# Patient Record
Sex: Female | Born: 1985 | Race: Black or African American | Hispanic: No | Marital: Married | Smoking: Former smoker
Health system: Southern US, Community
[De-identification: ages and names within clinical notes are randomized; demographics above are authoritative.]

## PROBLEM LIST (undated history)

## (undated) DIAGNOSIS — E739 Lactose intolerance, unspecified: Secondary | ICD-10-CM

## (undated) DIAGNOSIS — I10 Essential (primary) hypertension: Secondary | ICD-10-CM

## (undated) DIAGNOSIS — O009 Unspecified ectopic pregnancy without intrauterine pregnancy: Secondary | ICD-10-CM

## (undated) DIAGNOSIS — K5900 Constipation, unspecified: Secondary | ICD-10-CM

## (undated) DIAGNOSIS — Z87442 Personal history of urinary calculi: Secondary | ICD-10-CM

## (undated) DIAGNOSIS — F329 Major depressive disorder, single episode, unspecified: Secondary | ICD-10-CM

## (undated) DIAGNOSIS — R600 Localized edema: Secondary | ICD-10-CM

## (undated) DIAGNOSIS — E559 Vitamin D deficiency, unspecified: Secondary | ICD-10-CM

## (undated) DIAGNOSIS — Z8489 Family history of other specified conditions: Secondary | ICD-10-CM

## (undated) DIAGNOSIS — N200 Calculus of kidney: Secondary | ICD-10-CM

## (undated) DIAGNOSIS — M549 Dorsalgia, unspecified: Secondary | ICD-10-CM

## (undated) DIAGNOSIS — M2550 Pain in unspecified joint: Secondary | ICD-10-CM

## (undated) DIAGNOSIS — K219 Gastro-esophageal reflux disease without esophagitis: Secondary | ICD-10-CM

## (undated) DIAGNOSIS — R51 Headache: Secondary | ICD-10-CM

## (undated) DIAGNOSIS — F32A Depression, unspecified: Secondary | ICD-10-CM

## (undated) DIAGNOSIS — T7840XA Allergy, unspecified, initial encounter: Secondary | ICD-10-CM

## (undated) DIAGNOSIS — F419 Anxiety disorder, unspecified: Secondary | ICD-10-CM

## (undated) DIAGNOSIS — M1990 Unspecified osteoarthritis, unspecified site: Secondary | ICD-10-CM

## (undated) DIAGNOSIS — M069 Rheumatoid arthritis, unspecified: Secondary | ICD-10-CM

## (undated) DIAGNOSIS — J45909 Unspecified asthma, uncomplicated: Secondary | ICD-10-CM

## (undated) HISTORY — DX: Major depressive disorder, single episode, unspecified: F32.9

## (undated) HISTORY — DX: Dorsalgia, unspecified: M54.9

## (undated) HISTORY — DX: Rheumatoid arthritis, unspecified: M06.9

## (undated) HISTORY — DX: Vitamin D deficiency, unspecified: E55.9

## (undated) HISTORY — DX: Depression, unspecified: F32.A

## (undated) HISTORY — DX: Unspecified asthma, uncomplicated: J45.909

## (undated) HISTORY — DX: Unspecified osteoarthritis, unspecified site: M19.90

## (undated) HISTORY — DX: Gastro-esophageal reflux disease without esophagitis: K21.9

## (undated) HISTORY — DX: Allergy, unspecified, initial encounter: T78.40XA

## (undated) HISTORY — DX: Lactose intolerance, unspecified: E73.9

## (undated) HISTORY — DX: Localized edema: R60.0

## (undated) HISTORY — DX: Constipation, unspecified: K59.00

## (undated) HISTORY — DX: Anxiety disorder, unspecified: F41.9

## (undated) HISTORY — DX: Pain in unspecified joint: M25.50

---

## 2008-09-18 ENCOUNTER — Emergency Department (HOSPITAL_COMMUNITY): Admission: EM | Admit: 2008-09-18 | Discharge: 2008-09-18 | Payer: Self-pay | Admitting: Emergency Medicine

## 2009-10-01 ENCOUNTER — Encounter: Admission: RE | Admit: 2009-10-01 | Discharge: 2009-10-01 | Payer: Self-pay | Admitting: General Surgery

## 2010-07-25 ENCOUNTER — Inpatient Hospital Stay (HOSPITAL_COMMUNITY): Admission: AD | Admit: 2010-07-25 | Discharge: 2010-07-28 | Payer: Self-pay | Admitting: Obstetrics and Gynecology

## 2011-02-25 LAB — CBC
HCT: 32.2 % — ABNORMAL LOW (ref 36.0–46.0)
HCT: 38.3 % (ref 36.0–46.0)
MCH: 31.1 pg (ref 26.0–34.0)
MCH: 30.4 pg (ref 26.0–34.0)
MCHC: 34 g/dL (ref 30.0–36.0)
MCHC: 33.9 g/dL (ref 30.0–36.0)
MCV: 91.5 fL (ref 78.0–100.0)
Platelets: 145 10*3/uL — ABNORMAL LOW (ref 150–400)
RBC: 4.27 MIL/uL (ref 3.87–5.11)
WBC: 16.8 10*3/uL — ABNORMAL HIGH (ref 4.0–10.5)

## 2011-09-13 LAB — URINALYSIS, ROUTINE W REFLEX MICROSCOPIC
Bilirubin Urine: NEGATIVE
Protein, ur: NEGATIVE
Specific Gravity, Urine: 1.025
pH: 6

## 2011-09-13 LAB — URINE CULTURE

## 2011-09-13 LAB — URINE MICROSCOPIC-ADD ON

## 2011-12-13 DIAGNOSIS — O009 Unspecified ectopic pregnancy without intrauterine pregnancy: Secondary | ICD-10-CM

## 2011-12-13 HISTORY — DX: Unspecified ectopic pregnancy without intrauterine pregnancy: O00.90

## 2012-11-07 ENCOUNTER — Encounter (HOSPITAL_COMMUNITY): Payer: Self-pay

## 2012-11-07 ENCOUNTER — Inpatient Hospital Stay (HOSPITAL_COMMUNITY): Payer: BC Managed Care – PPO

## 2012-11-07 ENCOUNTER — Inpatient Hospital Stay (HOSPITAL_COMMUNITY)
Admission: AD | Admit: 2012-11-07 | Discharge: 2012-11-07 | Disposition: A | Discharge status: Home / Self Care | Payer: BC Managed Care – PPO | Source: Ambulatory Visit | Attending: Obstetrics and Gynecology | Admitting: Obstetrics and Gynecology

## 2012-11-07 DIAGNOSIS — O00109 Unspecified tubal pregnancy without intrauterine pregnancy: Secondary | ICD-10-CM | POA: Insufficient documentation

## 2012-11-07 LAB — TYPE AND SCREEN
ABO/RH(D): B POS
Antibody Screen: NEGATIVE

## 2012-11-07 LAB — CBC WITH DIFFERENTIAL/PLATELET
Basophils Relative: 0 % (ref 0–1)
Eosinophils Relative: 0 % (ref 0–5)
Hemoglobin: 12.2 g/dL (ref 12.0–15.0)
Lymphs Abs: 0.5 10*3/uL — ABNORMAL LOW (ref 0.7–4.0)
MCH: 29.3 pg (ref 26.0–34.0)
MCV: 84.4 fL (ref 78.0–100.0)
Neutro Abs: 18.2 10*3/uL — ABNORMAL HIGH (ref 1.7–7.7)

## 2012-11-07 LAB — CREATININE, SERUM
Creatinine, Ser: 0.72 mg/dL (ref 0.50–1.10)
GFR calc non Af Amer: >90 mL/min (ref >90)

## 2012-11-07 LAB — HCG, QUANTITATIVE, PREGNANCY: hCG, Beta Chain, Quant, S: 5171 m[IU]/mL — ABNORMAL HIGH (ref <5)

## 2012-11-07 LAB — AST: AST: 12 U/L (ref 0–37)

## 2012-11-07 MED ORDER — METHOTREXATE INJECTION FOR WOMEN'S HOSPITAL
50.0000 mg/m2 | Freq: Once | INTRAMUSCULAR | Status: AC
  Administered 2012-11-07: 85 mg via INTRAMUSCULAR
  Filled 2012-11-07: qty 1.7

## 2012-11-07 NOTE — MAU Note (Signed)
Pt reports left side and left lower abd pain x 3 hours, positive preg test 2 weeks ago, LMP 09/28/2012

## 2012-11-07 NOTE — ED Provider Notes (Signed)
History  Diane [name] 26 y.o. G2P1001      Chief Complaint  Patient presents with  . Vaginal Bleeding       Early pregnancy   Past Medical History  Diagnosis Date  . No pertinent past medical history     Past Surgical History  Procedure Date  . No past surgeries     History reviewed. No pertinent family history.  History  Substance Use Topics  . Smoking status: Former Games developer  . Smokeless tobacco: Not on file  . Alcohol Use: No    Allergies: No Known Allergies  Prescriptions prior to admission  Medication Sig Dispense Refill  . acetaminophen (TYLENOL) 500 MG tablet Take 1,000 mg by mouth daily as needed. For pain      . pantoprazole (PROTONIX) 40 MG tablet Take 40 mg by mouth daily as needed. For acid reflux      . Prenatal Vit-Fe Fumarate-FA (PRENATAL MULTIVITAMIN) TABS Take 1 tablet by mouth daily.        Physical Exam   Blood pressure 125/65, pulse 100, temperature 98.9 F (37.2 C), temperature source Oral, resp. rate 16, height 5\' 3"  (1.6 m), weight 65.953 kg (145 lb 6.4 oz), last menstrual period 09/28/2012, SpO2 100.00%.  Lungs clear bilat. RCR, no murmur Abdo soft, no rebound, mildly tender on left side. VE deferred  [place] No IUP.  Ovaries wnl with small CL cyst on Rt ovary.  Left adnexal mass separate from ovary, about 3+ cm in dameter, prob. GS 5+ mm, prob. YS, no EP.  Quant BHCG  5171 CBC wnl except WBC 19.7 AST/ALT wnl U/A wnl  ED Course  Hemodynamically stable  A/P  Left non ruptured ectopic pregnancy in a stable patient.  Quant BHCG 5171.  Good candidate for MTX.  Management of ectopic pregnancy explained.  Surgical approach discussed.  Patient opts for MTX.  Risks reviewed including rupture and hemorrhage ad risk of death.  Importance of presentation if increased pain/shoulder pain stressed.  Importance of f/u Quant BHCG explained.  Eventual need for 2nd MTX or surgery discussed.    Pt understands and agrees.  Genia Del MD   11/07/2012  At 10:52 am

## 2012-11-10 ENCOUNTER — Inpatient Hospital Stay (HOSPITAL_COMMUNITY)
Admission: AD | Admit: 2012-11-10 | Discharge: 2012-11-10 | Disposition: A | Discharge status: Home / Self Care | Payer: BC Managed Care – PPO | Source: Ambulatory Visit | Attending: Obstetrics & Gynecology | Admitting: Obstetrics & Gynecology

## 2012-11-10 DIAGNOSIS — O00109 Unspecified tubal pregnancy without intrauterine pregnancy: Secondary | ICD-10-CM | POA: Insufficient documentation

## 2012-11-10 LAB — HCG, QUANTITATIVE, PREGNANCY: hCG, Beta Chain, Quant, S: 8421 m[IU]/mL — ABNORMAL HIGH (ref <5)

## 2012-11-10 NOTE — Progress Notes (Signed)
Notified of BHCG level. Continue to monitor pt to come to office on Day 7 for repeat BHCG

## 2012-11-10 NOTE — MAU Note (Signed)
Pt reports having some bleeding in the morning and soreness in lower abd.

## 2012-11-11 HISTORY — PX: OTHER SURGICAL HISTORY: SHX169

## 2012-11-18 ENCOUNTER — Encounter (HOSPITAL_COMMUNITY): Payer: Self-pay | Admitting: Anesthesiology

## 2012-11-18 ENCOUNTER — Inpatient Hospital Stay (HOSPITAL_COMMUNITY): Payer: BC Managed Care – PPO

## 2012-11-18 ENCOUNTER — Encounter (HOSPITAL_COMMUNITY)
Admission: AD | Disposition: A | Discharge status: Home / Self Care | Payer: Self-pay | Source: Ambulatory Visit | Attending: Obstetrics and Gynecology

## 2012-11-18 ENCOUNTER — Ambulatory Visit (HOSPITAL_COMMUNITY)
Admission: AD | Admit: 2012-11-18 | Discharge: 2012-11-18 | Disposition: A | Discharge status: Home / Self Care | Payer: BC Managed Care – PPO | Source: Ambulatory Visit | Attending: Obstetrics and Gynecology | Admitting: Obstetrics and Gynecology

## 2012-11-18 ENCOUNTER — Inpatient Hospital Stay (HOSPITAL_COMMUNITY): Payer: BC Managed Care – PPO | Admitting: Anesthesiology

## 2012-11-18 ENCOUNTER — Encounter (HOSPITAL_COMMUNITY): Payer: Self-pay

## 2012-11-18 DIAGNOSIS — O00109 Unspecified tubal pregnancy without intrauterine pregnancy: Principal | ICD-10-CM | POA: Insufficient documentation

## 2012-11-18 DIAGNOSIS — Z9889 Other specified postprocedural states: Secondary | ICD-10-CM

## 2012-11-18 DIAGNOSIS — O009 Unspecified ectopic pregnancy without intrauterine pregnancy: Secondary | ICD-10-CM

## 2012-11-18 HISTORY — DX: Unspecified ectopic pregnancy without intrauterine pregnancy: O00.90

## 2012-11-18 HISTORY — PX: DILATION AND CURETTAGE OF UTERUS: SHX78

## 2012-11-18 HISTORY — PX: LAPAROSCOPY: SHX197

## 2012-11-18 LAB — CBC WITH DIFFERENTIAL/PLATELET
Basophils Absolute: 0 10*3/uL (ref 0.0–0.1)
HCT: 33.6 % — ABNORMAL LOW (ref 36.0–46.0)
Hemoglobin: 11.4 g/dL — ABNORMAL LOW (ref 12.0–15.0)
Lymphocytes Relative: 8 % — ABNORMAL LOW (ref 12–46)
Monocytes Absolute: 1.1 10*3/uL — ABNORMAL HIGH (ref 0.1–1.0)
Monocytes Relative: 8 % (ref 3–12)
Neutro Abs: 11.4 10*3/uL — ABNORMAL HIGH (ref 1.7–7.7)
RBC: 3.96 MIL/uL (ref 3.87–5.11)
RDW: 14.1 % (ref 11.5–15.5)
WBC: 13.7 10*3/uL — ABNORMAL HIGH (ref 4.0–10.5)

## 2012-11-18 LAB — TYPE AND SCREEN: Antibody Screen: NEGATIVE

## 2012-11-18 LAB — COMPREHENSIVE METABOLIC PANEL
AST: 13 U/L (ref 0–37)
CO2: 22 mEq/L (ref 19–32)
Chloride: 103 mEq/L (ref 96–112)
Creatinine, Ser: 0.74 mg/dL (ref 0.50–1.10)
GFR calc non Af Amer: >90 mL/min (ref >90)
Total Bilirubin: 0.4 mg/dL (ref 0.3–1.2)

## 2012-11-18 SURGERY — LAPAROSCOPY OPERATIVE
Anesthesia: General | Site: Vagina | Wound class: Clean Contaminated

## 2012-11-18 MED ORDER — MEPERIDINE HCL 25 MG/ML IJ SOLN: 6.2500 mg | INTRAMUSCULAR | Status: DC | PRN

## 2012-11-18 MED ORDER — PROPOFOL 10 MG/ML IV EMUL
INTRAVENOUS | Status: DC | PRN
  Administered 2012-11-18: 150 mg via INTRAVENOUS

## 2012-11-18 MED ORDER — KETOROLAC TROMETHAMINE 30 MG/ML IJ SOLN: 15.0000 mg | Freq: Once | INTRAMUSCULAR | Status: DC | PRN

## 2012-11-18 MED ORDER — PROMETHAZINE HCL 25 MG/ML IJ SOLN: 6.2500 mg | INTRAMUSCULAR | Status: DC | PRN

## 2012-11-18 MED ORDER — SUCCINYLCHOLINE CHLORIDE 20 MG/ML IJ SOLN
INTRAMUSCULAR | Status: DC | PRN
  Administered 2012-11-18: 100 mg via INTRAVENOUS

## 2012-11-18 MED ORDER — MIDAZOLAM HCL 2 MG/2ML IJ SOLN: 0.5000 mg | Freq: Once | INTRAMUSCULAR | Status: DC | PRN

## 2012-11-18 MED ORDER — MIDAZOLAM HCL 5 MG/5ML IJ SOLN
INTRAMUSCULAR | Status: DC | PRN
  Administered 2012-11-18: 2 mg via INTRAVENOUS

## 2012-11-18 MED ORDER — FENTANYL CITRATE 0.05 MG/ML IJ SOLN
INTRAMUSCULAR | Status: DC | PRN
  Administered 2012-11-18: 100 ug via INTRAVENOUS
  Administered 2012-11-18 (×3): 50 ug via INTRAVENOUS

## 2012-11-18 MED ORDER — GLYCOPYRROLATE 0.2 MG/ML IJ SOLN
INTRAMUSCULAR | Status: DC | PRN
  Administered 2012-11-18: 1 mg via INTRAVENOUS

## 2012-11-18 MED ORDER — DOXYCYCLINE HYCLATE 100 MG IV SOLR
100.0000 mg | Freq: Once | INTRAVENOUS | Status: AC
  Administered 2012-11-18: 100 mg via INTRAVENOUS
  Filled 2012-11-18: qty 100

## 2012-11-18 MED ORDER — LACTATED RINGERS IV SOLN
INTRAVENOUS | Status: DC | PRN
  Administered 2012-11-18 (×2): via INTRAVENOUS

## 2012-11-18 MED ORDER — HYDROCODONE-ACETAMINOPHEN 5-500 MG PO TABS: 1.0000 | ORAL_TABLET | Freq: Four times a day (QID) | ORAL | Status: DC | PRN

## 2012-11-18 MED ORDER — CITRIC ACID-SODIUM CITRATE 334-500 MG/5ML PO SOLN
30.0000 mL | Freq: Once | ORAL | Status: AC
  Administered 2012-11-18: 30 mL via ORAL
  Filled 2012-11-18: qty 15

## 2012-11-18 MED ORDER — ROCURONIUM BROMIDE 100 MG/10ML IV SOLN
INTRAVENOUS | Status: DC | PRN
  Administered 2012-11-18: 10 mg via INTRAVENOUS
  Administered 2012-11-18: 30 mg via INTRAVENOUS

## 2012-11-18 MED ORDER — NEOSTIGMINE METHYLSULFATE 1 MG/ML IJ SOLN
INTRAMUSCULAR | Status: DC | PRN
  Administered 2012-11-18: 5 mg via INTRAVENOUS

## 2012-11-18 MED ORDER — IBUPROFEN 800 MG PO TABS: 800.0000 mg | ORAL_TABLET | Freq: Three times a day (TID) | ORAL | Status: DC | PRN

## 2012-11-18 MED ORDER — KETOROLAC TROMETHAMINE 30 MG/ML IJ SOLN
INTRAMUSCULAR | Status: DC | PRN
  Administered 2012-11-18: 30 mg via INTRAMUSCULAR
  Administered 2012-11-18: 30 mg via INTRAVENOUS

## 2012-11-18 MED ORDER — DEXAMETHASONE SODIUM PHOSPHATE 4 MG/ML IJ SOLN
INTRAMUSCULAR | Status: DC | PRN
  Administered 2012-11-18: 10 mg via INTRAVENOUS

## 2012-11-18 MED ORDER — BUPIVACAINE HCL (PF) 0.25 % IJ SOLN
INTRAMUSCULAR | Status: DC | PRN
  Administered 2012-11-18: 10 mL

## 2012-11-18 MED ORDER — LACTATED RINGERS IR SOLN
Status: DC | PRN
  Administered 2012-11-18: 3000 mL

## 2012-11-18 MED ORDER — LIDOCAINE HCL (CARDIAC) 20 MG/ML IV SOLN
INTRAVENOUS | Status: DC | PRN
  Administered 2012-11-18: 20 mg via INTRAVENOUS

## 2012-11-18 MED ORDER — ONDANSETRON HCL 4 MG/2ML IJ SOLN
INTRAMUSCULAR | Status: DC | PRN
  Administered 2012-11-18: 4 mg via INTRAVENOUS

## 2012-11-18 MED ORDER — FENTANYL CITRATE 0.05 MG/ML IJ SOLN: 25.0000 ug | INTRAMUSCULAR | Status: DC | PRN

## 2012-11-18 MED ORDER — LACTATED RINGERS IV SOLN: INTRAVENOUS | Status: DC

## 2012-11-18 MED ORDER — SODIUM CHLORIDE 0.9 % IV SOLN: INTRAVENOUS | Status: DC

## 2012-11-18 SURGICAL SUPPLY — 44 items
BARRIER ADHS 3X4 INTERCEED (GAUZE/BANDAGES/DRESSINGS) IMPLANT
BLADE SURG 15 STRL LF C SS BP (BLADE) ×2 IMPLANT
BLADE SURG 15 STRL SS (BLADE) ×1
CABLE HIGH FREQUENCY MONO STRZ (ELECTRODE) ×3 IMPLANT
CHLORAPREP W/TINT 26ML (MISCELLANEOUS) IMPLANT
CLOTH BEACON ORANGE TIMEOUT ST (SAFETY) ×3 IMPLANT
COVER MAYO STAND STRL (DRAPES) IMPLANT
DERMABOND ADVANCED (GAUZE/BANDAGES/DRESSINGS) ×1
DERMABOND ADVANCED .7 DNX12 (GAUZE/BANDAGES/DRESSINGS) ×2 IMPLANT
DURAPREP 26ML APPLICATOR (WOUND CARE) ×3 IMPLANT
ELECT NEEDLE TIP 2.8 STRL (NEEDLE) ×3 IMPLANT
FORCEPS CUTTING 33CM 5MM (CUTTING FORCEPS) ×3 IMPLANT
GLOVE BIO SURGEON STRL SZ 6.5 (GLOVE) ×6 IMPLANT
GLOVE BIOGEL PI IND STRL 7.0 (GLOVE) ×4 IMPLANT
GLOVE BIOGEL PI INDICATOR 7.0 (GLOVE) ×2
GOWN PREVENTION PLUS LG XLONG (DISPOSABLE) ×9 IMPLANT
IV LACTATED RINGER IRRG 3000ML (IV SOLUTION) ×1
IV LR IRRIG 3000ML ARTHROMATIC (IV SOLUTION) ×2 IMPLANT
IV STOPCOCK 4 WAY 40  W/Y SET (IV SOLUTION)
IV STOPCOCK 4 WAY 40 W/Y SET (IV SOLUTION) IMPLANT
MANIPULATOR UTERINE 4.5 ZUMI (MISCELLANEOUS) IMPLANT
NEEDLE INSUFFLATION 14GA 120MM (NEEDLE) IMPLANT
PACK LAPAROSCOPY BASIN (CUSTOM PROCEDURE TRAY) ×3 IMPLANT
PENCIL BUTTON HOLSTER BLD 10FT (ELECTRODE) ×3 IMPLANT
POUCH SPECIMEN RETRIEVAL 10MM (ENDOMECHANICALS) ×3 IMPLANT
PROTECTOR NERVE ULNAR (MISCELLANEOUS) ×3 IMPLANT
SCISSORS LAP 5X35 DISP (ENDOMECHANICALS) IMPLANT
SEALER TISSUE G2 CVD JAW 35 (ENDOMECHANICALS) IMPLANT
SEALER TISSUE G2 CVD JAW 45CM (ENDOMECHANICALS)
SET IRRIG TUBING LAPAROSCOPIC (IRRIGATION / IRRIGATOR) ×3 IMPLANT
SOLUTION ELECTROLUBE (MISCELLANEOUS) IMPLANT
STENT BALLN UTERINE 3CM 6FR (Stent) IMPLANT
STENT BALLN UTERINE 4CM 6FR (STENTS) IMPLANT
SUT VICRYL 0 UR6 27IN ABS (SUTURE) IMPLANT
SUT VICRYL 4-0 PS2 18IN ABS (SUTURE) IMPLANT
SYR 50ML LL SCALE MARK (SYRINGE) IMPLANT
SYR 5ML LL (SYRINGE) ×3 IMPLANT
TOWEL OR 17X24 6PK STRL BLUE (TOWEL DISPOSABLE) ×6 IMPLANT
TRAY FOLEY CATH 14FR (SET/KITS/TRAYS/PACK) ×3 IMPLANT
TROCAR BALLN 12MMX100 BLUNT (TROCAR) IMPLANT
TROCAR XCEL NON-BLD 11X100MML (ENDOMECHANICALS) IMPLANT
TROCAR XCEL NON-BLD 5MMX100MML (ENDOMECHANICALS) ×6 IMPLANT
WARMER LAPAROSCOPE (MISCELLANEOUS) ×3 IMPLANT
WATER STERILE IRR 1000ML POUR (IV SOLUTION) ×3 IMPLANT

## 2012-11-18 NOTE — Anesthesia Preprocedure Evaluation (Signed)
Anesthesia Evaluation  Patient identified by MRN, date of birth, ID band Patient awake    Reviewed: Allergy & Precautions, H&P , Patient's Chart, lab work & pertinent test results, reviewed documented beta blocker date and time   History of Anesthesia Complications Negative for: history of anesthetic complications  Airway Mallampati: II TM Distance: >3 FB Neck ROM: full    Dental No notable dental hx.    Pulmonary neg pulmonary ROS,  breath sounds clear to auscultation  Pulmonary exam normal       Cardiovascular Exercise Tolerance: Good negative cardio ROS  Rhythm:regular Rate:Normal     Neuro/Psych negative neurological ROS  negative psych ROS   GI/Hepatic negative GI ROS, Neg liver ROS,   Endo/Other  negative endocrine ROS  Renal/GU negative Renal ROS     Musculoskeletal   Abdominal   Peds  Hematology negative hematology ROS (+)   Anesthesia Other Findings   Reproductive/Obstetrics negative OB ROS                           Anesthesia Physical Anesthesia Plan  ASA: II and emergent  Anesthesia Plan: General ETT   Post-op Pain Management:    Induction: Rapid sequence, Cricoid pressure planned and Intravenous  Airway Management Planned: Oral ETT  Additional Equipment:   Intra-op Plan:   Post-operative Plan:   Informed Consent: I have reviewed the patients History and Physical, chart, labs and discussed the procedure including the risks, benefits and alternatives for the proposed anesthesia with the patient or authorized representative who has indicated his/her understanding and acceptance.   Dental Advisory Given  Plan Discussed with: CRNA and Surgeon  Anesthesia Plan Comments:         Anesthesia Quick Evaluation

## 2012-11-18 NOTE — Brief Op Note (Signed)
11/18/2012  11:11 AM  PATIENT:  Diane [name]  26 y.o. female  PRE-OPERATIVE DIAGNOSIS:  Ruptured Ectopic( left)  POST-OPERATIVE DIAGNOSIS:  Ruptured Ectopic( left)  PROCEDURE:  Operative laparoscopy, Left salpingectomy, suction Dilation and currettage  SURGEON:  Surgeon(s) and Role:        * Samhita Kretsch Cathie Beams, MD - PRIMARY  PHYSICIAN ASSISTANT:   ASSISTANTS: Noland Fordyce, MD   ANESTHESIA:   general FINDINGS: HEMOPERITONEUM, NL APPENDIX, NL RIGHT TUBE AND OVARY, LEFT TUBE WITH DISTAL ECTOPIC ADHERENT TO LEFT SIDE WALL, NL LEFT OVARY  EBL:  Total I/O In: 1200 [I.V.:1200] Out: 275 [Urine:200; Blood:75]  BLOOD ADMINISTERED:none  DRAINS: none   LOCAL MEDICATIONS USED:  MARCAINE     SPECIMEN:  Source of Specimen:  left tube with ectopic  DISPOSITION OF SPECIMEN:  PATHOLOGY  COUNTS:  YES  TOURNIQUET:  * No tourniquets in log *  DICTATION: .Other Dictation: Dictation Number   PLAN OF CARE: Discharge to home after PACU  PATIENT DISPOSITION:  PACU - hemodynamically stable.   Delay start of Pharmacological VTE agent (>24hrs) due to surgical blood loss or risk of bleeding: no

## 2012-11-18 NOTE — Transfer of Care (Signed)
Immediate Anesthesia Transfer of Care Note  Patient: Diane [name]  Procedure(s) Performed: Procedure(s) (LRB) with comments: LAPAROSCOPY OPERATIVE (N/A) - Operative Laparoscopy with removal of left fallopian tube and ectopic pregnancy, Dilation and evacuation DILATATION AND CURETTAGE (N/A)  Patient Location: PACU  Anesthesia Type:General  Level of Consciousness: awake, alert  and oriented  Airway & Oxygen Therapy: Patient Spontanous Breathing  Post-op Assessment: Report given to PACU RN and Post -op Vital signs reviewed and stable  Post vital signs: Reviewed and stable  Complications: No apparent anesthesia complications

## 2012-11-18 NOTE — MAU Note (Signed)
Pt states has ectopic and had MTX on 11/27. Being followed in office by Dr Cherly Hensen

## 2012-11-18 NOTE — MAU Note (Signed)
Having abdominal pain that woke me up - pain on L side. Was having bad cramping pain yesterday am and took Tylenol and it eased off. Pressure in pelvis.

## 2012-11-18 NOTE — Anesthesia Procedure Notes (Signed)
Procedure Name: Intubation Date/Time: 11/18/2012 9:41 AM Performed by: Lincoln Brigham Pre-anesthesia Checklist: Patient identified, Patient being monitored, Emergency Drugs available, Timeout performed and Suction available Patient Re-evaluated:Patient Re-evaluated prior to inductionOxygen Delivery Method: Circle system utilized Preoxygenation: Pre-oxygenation with 100% oxygen Intubation Type: IV induction Ventilation: Mask ventilation without difficulty Laryngoscope Size: Miller and 2 Grade View: Grade I Tube type: Oral Number of attempts: 1 Airway Equipment and Method: Stylet Placement Confirmation: ETT inserted through vocal cords under direct vision,  breath sounds checked- equal and bilateral and positive ETCO2 Secured at: 20 cm Tube secured with: Tape Dental Injury: Teeth and Oropharynx as per pre-operative assessment

## 2012-11-18 NOTE — H&P (Addendum)
S: Increased abdominal pain, unknown left-sided ectopic pregnancy being treated with methotrexate.   History of present illness: 26 year old G2 P1 001 with plans and desired pregnancy that was diagnosed as ectopic on 11/27. The patient was given methotrexate on 11:30 and followed up for day 4 and 7 labs. Patient now presents with worsening left-sided pain. She says pain is sometimes intermittent but other times will last for one hour and will be constant. Patient notes continued vaginal spotting since her ectopic diagnosis. Now rating pain Left side 6/10 and pain is radiating to back.   Serial BHCG's: 11/10/12 = 8421  11/15/12 = 8470   Past medical history: Patient denies Past GYN history: Patient denies prior GYN surgery, patient denies history of gonorrhea Chlamydia or pelvic inflammatory disease Past OB history: Spontaneous vaginal delivery x1 Past surgical history: Patient denies  History   Substance Use Topics   .  Smoking status:  Current Every Day Smoker     Types:  Cigarettes   .  Smokeless tobacco:  Not on file   .  Alcohol Use:  No    Allergies: No Known Allergies  Prescriptions prior to admission   Medication  Sig  Dispense  Refill   .  acetaminophen (TYLENOL) 500 MG tablet  Take 1,000 mg by mouth daily as needed. For pain     .  methotrexate 1 gm SOLR  Inject 50 mg/m2 into the muscle once.     .  pantoprazole (PROTONIX) 40 MG tablet  Take 40 mg by mouth daily as needed. For acid reflux     .  Prenatal Vit-Fe Fumarate-FA (PRENATAL MULTIVITAMIN) TABS  Take 1 tablet by mouth daily.      Review of Systems  Constitutional: Negative.  HENT: Negative.  Eyes: Negative.  Respiratory: Negative.  Cardiovascular: Negative.  Gastrointestinal: Positive for diarrhea.  Slight loose stool 11/17/12  Genitourinary: Negative.  Skin: Negative.  Neurological: Negative.  Endo/Heme/Allergies: Negative.  Psychiatric/Behavioral: Negative.   Physical Exam   Blood pressure 134/73, pulse 109,  temperature 98.6 F (37 C), resp. rate 20, height 5\' 3"  (1.6 m), weight 65.137 kg (143 lb 9.6 oz), last menstrual period 09/28/2012.  Physical Exam  Constitutional: She is oriented to person, place, and time. She appears well-developed and well-nourished.  HENT:  Cardiovascular: Normal rate, regular rhythm and normal heart sounds.  Respiratory: Effort normal.  GI: Soft. Bowel sounds are normal. There is tenderness Left sided tenderness, radiating to back. no distention, no rebound, no guarding.  Genitourinary: Vagina normal. His right and left the nurses station  Musculoskeletal: Normal range of motion.  Neurological: She is alert and oriented to person, place, and time. She has normal reflexes.  Skin: Skin is warm and dry.  Psychiatric: She has a normal mood and affect.   Beta: pending CBC    Component Value Date/Time   WBC 13.7* 11/18/2012 0610   RBC 3.96 11/18/2012 0610   HGB 11.4* 11/18/2012 0610   HCT 33.6* 11/18/2012 0610   PLT 268 11/18/2012 0610   MCV 84.8 11/18/2012 0610   MCH 28.8 11/18/2012 0610   MCHC 33.9 11/18/2012 0610   RDW 14.1 11/18/2012 0610   LYMPHSABS 1.2 11/18/2012 0610   MONOABS 1.1* 11/18/2012 0610   EOSABS 0.0 11/18/2012 0610   BASOSABS 0.0 11/18/2012 0610      MAU Course   Procedures  F/u Ultrasound for assessment of Left sided ectopic pregnancy status.  Assessment and Plan   F/U ultrasound: Mass of 7.3x3.8 x 4.9  in Lt Adnexa with complex mass and free fluid c/w Rupture of ectopic pregnancy. In Patient hemodynamically stable.  NPO  Prep for OR  IV access  Labs: CBC, CMP, Type and Screen and discuss with patient's ultrasound findings of increased hemoperitoneum and left adnexal mass consistent with growing and/or ruptured ectopic pregnancy. Given that patient is overall hemodynamically stable with a normal CBC I gave the patient several options. We did discuss the possibility of tubal extrusion of the ectopic pregnancy with increased pain and hemoperitoneum  at this time that would be self-limited. In this situation patient would be a candidate for close observation maintaining n.p.o. status and expectant management. We discussed that if pain, bleeding or drop in hemoglobin was noted she would need to proceed with operative intervention and this may be on a more emergent basis. Initial management would be in the hospital. The patient was not interested in this management for fear of worsening condition. I then discussed operative management with the patient. We talked about the pros and cons of a left salpingectomy versus a salpingostomy. I discussed with the patient that if the contralateral tube is damaged and I proceeded with a salpingectomy she would likely have subsequent infertility that would only be treated by in vitro fertilization. I discussed with patient's the usual limitations of IVF. I also discussed with the patient the possibility of attempted salpingostomy with removal of ectopic pregnancy. We did call about reoperation for adhesions, repeat ectopic, and no guarantee of tubal patency with a salpingostomy. I discussed with patient that my usual approach would be to proceed with salpingectomy if the contralateral tube is normal and a salpingostomy at the contralateral tube appears diseased. Patient was very concerned about possible reoperation rate and asks for salpingectomy regardless of the condition of the contralateral tube. We discussed general risks of the operative intervention including anesthesia risks, bleeding, infection, damage to internal organs. Given that there is thickened endometrial stripe I will also plan a D&C to help control patient's bleeding and for the rare event of a heterotopic as the patient has already had methotrexate.  FOGLEMAN,KELLY A. 11/18/2012 7:18 AM     Addendum. Hx reviewed and plan disc with pt. Await oR due to other emergencies ongoing and pt appears stable

## 2012-11-18 NOTE — Anesthesia Postprocedure Evaluation (Signed)
Anesthesia Post Note  Patient: Diane [name]  Procedure(s) Performed: Procedure(s) (LRB): LAPAROSCOPY OPERATIVE (N/A) DILATATION AND CURETTAGE (N/A)  Anesthesia type: GA  Patient location: PACU  Post pain: Pain level controlled  Post assessment: Post-op Vital signs reviewed  Last Vitals:  Filed Vitals:   11/18/12 1200  BP: 119/46  Pulse: 77  Temp: 37.2 C  Resp: 15    Post vital signs: Reviewed  Level of consciousness: sedated  Complications: No apparent anesthesia complications

## 2012-11-19 ENCOUNTER — Encounter (HOSPITAL_COMMUNITY): Payer: Self-pay | Admitting: Obstetrics and Gynecology

## 2012-11-19 NOTE — Op Note (Signed)
Diane [name], Diane [name]             ACCOUNT NO.:  [id number]  MEDICAL RECORD NO.:  [id number]  LOCATION:  [facility]                          FACILITY:  [facility]  PHYSICIAN:  Maxie Better, M.D.DATE OF BIRTH:  1986/01/14  DATE OF PROCEDURE:  11/18/2012 DATE OF DISCHARGE:  11/18/2012                              OPERATIVE REPORT   PREOPERATIVE DIAGNOSIS:  Ruptured left ectopic pregnancy.  PROCEDURE:  Operative laparoscopy, left salpingectomy, suction dilation and evacuation.  POSTOPERATIVE DIAGNOSIS:  Ruptured left ectopic pregnancy.  ANESTHESIA:  General.  SURGEON:  Maxie Better, M.D.  ASSISTANT:  Lendon Colonel, MD  PROCEDURE:  Under adequate general anesthesia, the patient was placed in the dorsal lithotomy position.  She was sterilely prepped and draped in usual fashion.  An indwelling Foley catheter was sterilely placed. Bivalve speculum was placed in the vagina.  A single-tooth tenaculum was placed on the anterior lip of the cervix.  The cervix was serially dilated up to a #27 Pratt dilator.  A #7 mm curved suction cannula was introduced into the uterine cavity.  Moderate amount of tissue was obtained.  The cavity was then gently curetted and suctioned.  When the uterine cavity wall was felt to be clean, an Acorn cannula was introduced into the cervical os and attached to the tenaculum for manipulation.  The bivalve speculum was removed.  Attention was then turned to the abdomen.  A 0.25% Marcaine was injected infraumbilically. Infraumbilical incision was then made.  Veress needle was introduced and tested with normal saline.  Carbon dioxide was insufflated with a opening pressure of 7.  A 3-1/2 L of CO2 was insufflated.  Veress needle was then removed.  A 10 mm disposable trocar with sleeve was introduced into the abdomen without incident.  A lighted video laparoscope was then placed through that port.  Entry into the abdomen was notable for hemoperitoneum noted in  the anterior and posterior cul-de-sac with clotted blood.  The panoramic inspection revealed that liver edge with a slightly dull dusky appearance.  Attention was then turned back to the pelvis.  Appendix was seen to be normal.  In the right and left lower quadrant, a 0.25% Marcaine was injected.  Small incisions made and 5-mm ports were placed in both sites under direct visualization.  The pelvis was then inspected with manipulation of the uterus and using a probe, the clotted blood was suctioned from the posterior cul-de-sac and the anterior cul-de-sac.  Normal tubes and ovaries were noted on the right. The left tube was clearly distended in the lower distal portion with the mass attached to the left pelvic sidewall.  With blunt dissection, the mass was lifted off the pelvic sidewall, and the patient who had requested the salpingectomy had the tube placed on traction.  The underlying mesosalpinx was then serially clamped, cauterized, and cut using the gyrus until the fallopian tube with the ectopic pregnancy was removed.  The ovary was then noted on the left to be normal.  On the left side wall, there was clotted blood and/or tissue which was bluntly removed off the sidewall.  Replacing the scope with endobag was then Performed and a 5-mm scope was then placed in the right  lower quadrant and the tube with the adnexal mass was then scooped into the endobag and brought up to the umbilicus and taken out.  The infraumbilical site was then reinserted. The abdomen reinsufflated.  The pelvis was then copiously irrigated and suctioned of debris.  The incision site on the left was noted to be with good hemostasis with the procedure felt to be complete.  The lower ports were then removed.  The abdomen was then deflated.  The infraumbilical site was removed taking care not to bring up any underlying structures.  The infraumbilical fascia was identified and 0 Vicryl x2 placed for a closure of the  fascia.  The incisions otherwise was closed with 4-0 Vicryl subcuticular stitch and Dermabond placed.  Instruments from the vagina was removed.  SPECIMEN:  Left fallopian tube with ectopic pregnancy and endometrial curettage sent to pathology.  ESTIMATED BLOOD LOSS:  Minimal.  COMPLICATION:  None.  The patient tolerated the procedure well, was transferred to recovery room in stable condition.     Maxie Better, M.D.     /MEDQ  D:  11/18/2012  T:  11/19/2012  Job:  [id number]

## 2013-01-26 ENCOUNTER — Ambulatory Visit (INDEPENDENT_AMBULATORY_CARE_PROVIDER_SITE_OTHER): Payer: BC Managed Care – PPO | Admitting: Physician Assistant

## 2013-01-26 VITALS — BP 120/82 | HR 100 | Temp 98.4°F | Resp 16 | Ht 63.0 in | Wt 136.0 lb

## 2013-01-26 DIAGNOSIS — J329 Chronic sinusitis, unspecified: Secondary | ICD-10-CM

## 2013-01-26 DIAGNOSIS — J309 Allergic rhinitis, unspecified: Secondary | ICD-10-CM

## 2013-01-26 MED ORDER — FEXOFENADINE HCL 60 MG PO TABS: 60.0000 mg | ORAL_TABLET | Freq: Two times a day (BID) | ORAL | Status: DC

## 2013-01-26 MED ORDER — AMOXICILLIN-POT CLAVULANATE 875-125 MG PO TABS: 1.0000 | ORAL_TABLET | Freq: Two times a day (BID) | ORAL | Status: DC

## 2013-01-26 NOTE — Progress Notes (Signed)
Subjective:    Patient ID: Diane [name], female    DOB: [birth year], 27 y.o.   MRN: [id number]  HPI   Diane [name] is a pleasant 27 yr old female here with concern for illness.  States she is experiencing nasal congestion and "sinus drainage down my throat", now it's "starting to settle in my chest."  Diane [name] states this initially started about a week ago.  Took Mucinex and it "eased off" now in the last 2-3 days symptoms have come back "full force".  Has only a little cough, but states she can feel her chest tightening.  Afraid the cough will worsen.  Denies fever, chills, sore throat, ear pain.  Endorses some facial pain and pressure.  States that she has a history of asthma.  Uses albuterol only when she is sick with bronchitis.  Denies SOB or wheezing.  Diane [name] currently smokes about 1/2ppd.    Diane [name] states that her allergies are "out of control".  States she has previously had immunotherapy shots but stopped.  Tried Zyrtec but that stopped working.  Same with Claritin.  Has not tried Careers adviser.  Does not want to use a nasal steroid.    Review of Systems  Constitutional: Negative for fever and chills.  HENT: Positive for congestion, rhinorrhea (yellow and green) and sinus pressure. Negative for sore throat.   Respiratory: Positive for cough. Negative for shortness of breath and wheezing.   Cardiovascular: Negative.   Gastrointestinal: Negative.   Musculoskeletal: Negative.   Skin: Negative.   Neurological: Negative.        Objective:   Physical Exam  Vitals reviewed. Constitutional: She is oriented to person, place, and time. She appears well-developed and well-nourished. No distress.  HENT:  Head: Normocephalic and atraumatic.  Right Ear: Tympanic membrane and ear canal normal.  Left Ear: Tympanic membrane and ear canal normal.  Nose: Right sinus exhibits maxillary sinus tenderness. Right sinus exhibits no frontal sinus tenderness. Left sinus exhibits maxillary sinus tenderness. Left sinus exhibits  no frontal sinus tenderness.  Mouth/Throat: Uvula is midline, oropharynx is clear and moist and mucous membranes are normal.  Eyes: Conjunctivae are normal. No scleral icterus.  Neck: Neck supple.  Cardiovascular: Normal rate, regular rhythm and normal heart sounds.  Exam reveals no gallop and no friction rub.   No murmur heard. Pulmonary/Chest: Effort normal and breath sounds normal. She has no wheezes. She has no rales.  Lymphadenopathy:    She has no cervical adenopathy.  Neurological: She is alert and oriented to person, place, and time.  Skin: Skin is warm and dry.  Psychiatric: She has a normal mood and affect. Her behavior is normal.     Filed Vitals:   01/26/13 0902  BP: 120/82  Pulse: 100  Temp: 98.4 F (36.9 C)  Resp: 16        Assessment & Plan:  Sinusitis - Plan: amoxicillin-clavulanate (AUGMENTIN) 875-125 MG per tablet  Allergic rhinitis - Plan: fexofenadine (ALLEGRA) 60 MG tablet   Diane [name] is a pleasant 27 yr old female with URI symptoms, allergic rhinitis, and possibly an early sinusitis.  Discussed with Diane [name] that this may be viral in etiology, but given some sinus tenderness and somewhat of a double sickening, I think it's reasonable to treat with antibiotics.  Will start augmentin today.  Encouraged her to continue mucinex BID.  Will add Allegra 60mg  BID for control of allergic rhinitis.  Discussed with Diane [name] that I think a nasal steroid would be beneficial for her,  but Diane [name] does not want to use a nasal spray.  Encouraged plenty of fluids and rest as well as smoking cessation.  Discussed RTC precautions.  Diane [name] will let us know if worsening or not improving.

## 2013-01-26 NOTE — Patient Instructions (Addendum)
Begin the antibiotic (Augmentin) today.  Be sure to finish the full course as directed.  Take with food to reduce stomach upset.    Continue Mucinex twice daily to help clear congestion  Being Allegra 60mg  twice daily to help with post-nasal drainage and nasal congestion.  Plenty of fluids (water is best!) and rest  STOP SMOKING!   Sinusitis Sinusitis is redness, soreness, and swelling (inflammation) of the paranasal sinuses. Paranasal sinuses are air pockets within the bones of your face (beneath the eyes, the middle of the forehead, or above the eyes). In healthy paranasal sinuses, mucus is able to drain out, and air is able to circulate through them by way of your nose. However, when your paranasal sinuses are inflamed, mucus and air can become trapped. This can allow bacteria and other germs to grow and cause infection. Sinusitis can develop quickly and last only a short time (acute) or continue over a long period (chronic). Sinusitis that lasts for more than 12 weeks is considered chronic.  CAUSES  Causes of sinusitis include:  Allergies.  Structural abnormalities, such as displacement of the cartilage that separates your nostrils (deviated septum), which can decrease the air flow through your nose and sinuses and affect sinus drainage.  Functional abnormalities, such as when the small hairs (cilia) that line your sinuses and help remove mucus do not work properly or are not present. SYMPTOMS  Symptoms of acute and chronic sinusitis are the same. The primary symptoms are pain and pressure around the affected sinuses. Other symptoms include:  Upper toothache.  Earache.  Headache.  Bad breath.  Decreased sense of smell and taste.  A cough, which worsens when you are lying flat.  Fatigue.  Fever.  Thick drainage from your nose, which often is green and may contain pus (purulent).  Swelling and warmth over the affected sinuses. DIAGNOSIS  Your caregiver will perform a  physical exam. During the exam, your caregiver may:  Look in your nose for signs of abnormal growths in your nostrils (nasal polyps).  Tap over the affected sinus to check for signs of infection.  View the inside of your sinuses (endoscopy) with a special imaging device with a light attached (endoscope), which is inserted into your sinuses. If your caregiver suspects that you have chronic sinusitis, one or more of the following tests may be recommended:  Allergy tests.  Nasal culture A sample of mucus is taken from your nose and sent to a lab and screened for bacteria.  Nasal cytology A sample of mucus is taken from your nose and examined by your caregiver to determine if your sinusitis is related to an allergy. TREATMENT  Most cases of acute sinusitis are related to a viral infection and will resolve on their own within 10 days. Sometimes medicines are prescribed to help relieve symptoms (pain medicine, decongestants, nasal steroid sprays, or saline sprays).  However, for sinusitis related to a bacterial infection, your caregiver will prescribe antibiotic medicines. These are medicines that will help kill the bacteria causing the infection.  Rarely, sinusitis is caused by a fungal infection. In theses cases, your caregiver will prescribe antifungal medicine. For some cases of chronic sinusitis, surgery is needed. Generally, these are cases in which sinusitis recurs more than 3 times per year, despite other treatments. HOME CARE INSTRUCTIONS   Drink plenty of water. Water helps thin the mucus so your sinuses can drain more easily.  Use a humidifier.  Inhale steam 3 to 4 times a day (for  example, sit in the bathroom with the shower running).  Apply a warm, moist washcloth to your face 3 to 4 times a day, or as directed by your caregiver.  Use saline nasal sprays to help moisten and clean your sinuses.  Take over-the-counter or prescription medicines for pain, discomfort, or fever only  as directed by your caregiver. SEEK IMMEDIATE MEDICAL CARE IF:  You have increasing pain or severe headaches.  You have nausea, vomiting, or drowsiness.  You have swelling around your face.  You have vision problems.  You have a stiff neck.  You have difficulty breathing. MAKE SURE YOU:   Understand these instructions.  Will watch your condition.  Will get help right away if you are not doing well or get worse. Document Released: 11/28/2005 Document Revised: 02/20/2012 Document Reviewed: 12/13/2011 [facility] Patient Information 2013 [place], [place].

## 2013-01-28 ENCOUNTER — Telehealth: Payer: Self-pay

## 2013-01-28 MED ORDER — FLUCONAZOLE 150 MG PO TABS: 150.0000 mg | ORAL_TABLET | Freq: Once | ORAL | Status: DC

## 2013-01-28 NOTE — Telephone Encounter (Signed)
Pt says that the antibotic we put her on has given her yeast infection would like duflucan called in if possible please call [phone] [facility] on [address]

## 2013-01-28 NOTE — Telephone Encounter (Signed)
Called her to advise this is sent in for her.

## 2013-05-05 ENCOUNTER — Ambulatory Visit (INDEPENDENT_AMBULATORY_CARE_PROVIDER_SITE_OTHER): Payer: BC Managed Care – PPO | Admitting: Family Medicine

## 2013-05-05 ENCOUNTER — Encounter: Payer: Self-pay | Admitting: Family Medicine

## 2013-05-05 VITALS — BP 143/87 | HR 114 | Temp 98.5°F | Resp 16 | Ht 63.0 in | Wt 143.8 lb

## 2013-05-05 DIAGNOSIS — J45909 Unspecified asthma, uncomplicated: Secondary | ICD-10-CM

## 2013-05-05 DIAGNOSIS — E8809 Other disorders of plasma-protein metabolism, not elsewhere classified: Secondary | ICD-10-CM

## 2013-05-05 DIAGNOSIS — J329 Chronic sinusitis, unspecified: Secondary | ICD-10-CM

## 2013-05-05 MED ORDER — AZITHROMYCIN 250 MG PO TABS: ORAL_TABLET | ORAL | Status: DC

## 2013-05-05 MED ORDER — METHYLPREDNISOLONE ACETATE 80 MG/ML IJ SUSP
80.0000 mg | Freq: Once | INTRAMUSCULAR | Status: AC
  Administered 2013-05-05: 80 mg via INTRAMUSCULAR

## 2013-05-05 MED ORDER — ALBUTEROL SULFATE HFA 108 (90 BASE) MCG/ACT IN AERS: 2.0000 | INHALATION_SPRAY | Freq: Four times a day (QID) | RESPIRATORY_TRACT | Status: DC | PRN

## 2013-05-05 NOTE — Patient Instructions (Addendum)
Asthma, Acute Bronchospasm Your exam shows you have asthma, or acute bronchospasm that acts like asthma. Bronchospasm means your air passages become narrowed. These conditions are due to inflammation and airway spasm that cause narrowing of the bronchial tubes in the lungs. This causes you to have wheezing and shortness of breath. POSSIBLE TRIGGERS  Animal dander from the skin, hair, or feathers of animals.  Dust mites contained in house dust.  Cockroaches.  Pollen from trees or grass.  Mold.  Cigarette or tobacco smoke.  Air pollutants such as dust, household cleaners, hair sprays, aerosol sprays, paint fumes, strong chemicals, or strong odors.  Cold air or weather changes. Cold air may cause inflammation. Winds increase molds and pollens in the air.  Strong emotions such as crying or laughing hard.  Stress.  Certain medicines such as aspirin or beta-blockers.  Sulfites in such foods and drinks as dried fruits and wine.  Infections or inflammatory conditions such as a flu, cold, or inflammation of the nasal membranes (rhinitis).  Gastroesophageal reflux disease (GERD). GERD is a condition where stomach acid backs up into your throat (esophagus).  Exercise or strenous activity. TREATMENT  Treatment is aimed at making the narrowed airways larger. Mild asthma or bronchospasm is usually controlled with inhaled medicines. Albuterol is a common medicine that you breathe in to open spastic or narrowed airways. Steroid medicine is also used to reduce the inflammation when an attack is moderate or severe. Antibiotics are only used if a bacterial infection is present.  HOME CARE INSTRUCTIONS   Rest.  Drink plenty of liquids. This helps the mucus to remain thin and easily coughed up. Do not use caffeine or alcohol.  Do not smoke. Avoid being exposed to secondhand smoke.  You play a critical role in keeping yourself in good health. Avoid exposure to things that cause you to wheeze.  Avoid exposure to things that cause you to have breathing problems. Keep your medicines up-to-date and available. Carefully follow your caregiver's treatment plan.  When pollen or pollution is bad, keep windows closed and use an air conditioner or go to places with air conditioning.  Take your medicine exactly as prescribed.  Asthma requires careful medical attention. See your caregiver for follow-up as advised. If you are more than [redacted] weeks pregnant and you were prescribed any new medicines, let your obstetrician know about the visit and how you are doing. Arrange a recheck. SEEK IMMEDIATE MEDICAL CARE IF:   You are getting worse.  You have trouble breathing. If severe, call your local emergency services 911 in [place]  You develop chest pain or discomfort.  You are throwing up or not drinking fluids.  You are coughing up yellow, green, brown, or bloody sputum.  You have a fever or persistent symptoms for more than 2 3 days.  You have a fever and your symptoms suddenly get worse.  You have trouble swallowing. MAKE SURE YOU:   Understand these instructions.  Will watch your condition.  Will get help right away if you are not doing well or get worse. Document Released: 03/15/2007 Document Revised: 11/14/2012 Document Reviewed: 11/12/2007 [facility] Patient Information 2014 [place], [place]. Sinusitis Sinusitis is redness, soreness, and swelling (inflammation) of the paranasal sinuses. Paranasal sinuses are air pockets within the bones of your face (beneath the eyes, the middle of the forehead, or above the eyes). In healthy paranasal sinuses, mucus is able to drain out, and air is able to circulate through them by way of your nose. However, when  your paranasal sinuses are inflamed, mucus and air can become trapped. This can allow bacteria and other germs to grow and cause infection. Sinusitis can develop quickly and last only a short time (acute) or continue over a long period (chronic).  Sinusitis that lasts for more than 12 weeks is considered chronic.  CAUSES  Causes of sinusitis include:  Allergies.  Structural abnormalities, such as displacement of the cartilage that separates your nostrils (deviated septum), which can decrease the air flow through your nose and sinuses and affect sinus drainage.  Functional abnormalities, such as when the small hairs (cilia) that line your sinuses and help remove mucus do not work properly or are not present. SYMPTOMS  Symptoms of acute and chronic sinusitis are the same. The primary symptoms are pain and pressure around the affected sinuses. Other symptoms include:  Upper toothache.  Earache.  Headache.  Bad breath.  Decreased sense of smell and taste.  A cough, which worsens when you are lying flat.  Fatigue.  Fever.  Thick drainage from your nose, which often is green and may contain pus (purulent).  Swelling and warmth over the affected sinuses. DIAGNOSIS  Your caregiver will perform a physical exam. During the exam, your caregiver may:  Look in your nose for signs of abnormal growths in your nostrils (nasal polyps).  Tap over the affected sinus to check for signs of infection.  View the inside of your sinuses (endoscopy) with a special imaging device with a light attached (endoscope), which is inserted into your sinuses. If your caregiver suspects that you have chronic sinusitis, one or more of the following tests may be recommended:  Allergy tests.  Nasal culture A sample of mucus is taken from your nose and sent to a lab and screened for bacteria.  Nasal cytology A sample of mucus is taken from your nose and examined by your caregiver to determine if your sinusitis is related to an allergy. TREATMENT  Most cases of acute sinusitis are related to a viral infection and will resolve on their own within 10 days. Sometimes medicines are prescribed to help relieve symptoms (pain medicine, decongestants, nasal  steroid sprays, or saline sprays).  However, for sinusitis related to a bacterial infection, your caregiver will prescribe antibiotic medicines. These are medicines that will help kill the bacteria causing the infection.  Rarely, sinusitis is caused by a fungal infection. In theses cases, your caregiver will prescribe antifungal medicine. For some cases of chronic sinusitis, surgery is needed. Generally, these are cases in which sinusitis recurs more than 3 times per year, despite other treatments. HOME CARE INSTRUCTIONS   Drink plenty of water. Water helps thin the mucus so your sinuses can drain more easily.  Use a humidifier.  Inhale steam 3 to 4 times a day (for example, sit in the bathroom with the shower running).  Apply a warm, moist washcloth to your face 3 to 4 times a day, or as directed by your caregiver.  Use saline nasal sprays to help moisten and clean your sinuses.  Take over-the-counter or prescription medicines for pain, discomfort, or fever only as directed by your caregiver. SEEK IMMEDIATE MEDICAL CARE IF:  You have increasing pain or severe headaches.  You have nausea, vomiting, or drowsiness.  You have swelling around your face.  You have vision problems.  You have a stiff neck.  You have difficulty breathing. MAKE SURE YOU:   Understand these instructions.  Will watch your condition.  Will get help right away  if you are not doing well or get worse. Document Released: 11/28/2005 Document Revised: 02/20/2012 Document Reviewed: 12/13/2011 [facility] Patient Information 2014 [place], [place].

## 2013-05-05 NOTE — Progress Notes (Signed)
Patient ID: ARIANNY PUN MRN: [id number], DOB: [birth year], 27 y.o. Date of Encounter: 05/05/2013, 8:44 AM  Primary Physician: No primary provider on file.  Chief Complaint:  Chief Complaint  Patient presents with  . Cough  . 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 Afebrile. No chills. Nasal congestion thick and green/yellow. Sinus pressure is the worst symptom. Cough is productive secondary to post nasal drip and not associated with time of day. Ears feel full, leading to sensation of muffled hearing. Has tried OTC cold preps without success. No GI complaints.   No recent antibiotics, recent travels, or sick contacts   No leg trauma, sedentary periods, h/o cancer, or tobacco use.  Past Medical History  Diagnosis Date  . Ectopic pregnancy without intrauterine pregnancy 2013  . Allergy   . Arthritis   . GERD (gastroesophageal reflux disease)   . Asthma      Home Meds: Prior to Admission medications   Medication Sig Start Date End Date Taking? Authorizing Provider  fexofenadine (ALLEGRA) 60 MG tablet Take 1 tablet (60 mg total) by mouth 2 (two) times daily. 01/26/13  Yes Godfrey Pick, PA-C  Norgestimate-Ethinyl Estradiol Triphasic (ORTHO TRI-CYCLEN, 28,) 0.18/0.215/0.25 MG-35 MCG tablet Take 1 tablet by mouth daily.   Yes Historical Provider, MD  pantoprazole (PROTONIX) 40 MG tablet Take 40 mg by mouth daily.   Yes Historical Provider, MD    Allergies: No Known Allergies  History   Social History  . Marital Status: Married    Spouse Name: N/A    Number of Children: N/A  . Years of Education: N/A   Occupational History  . Not on file.   Social History Main Topics  . Smoking status: Current Every Day Smoker    Types: Cigarettes  . Smokeless tobacco: Not on file  . Alcohol Use: No  . Drug Use: No  . Sexually Active: Yes   Other  Topics Concern  . Not on file   Social History Narrative  . No narrative on file     Review of Systems: Constitutional: negative for chills, fever, night sweats or weight changes Cardiovascular: negative for chest pain or palpitations Respiratory: negative for hemoptysis, wheezing, or shortness of breath Abdominal: negative for abdominal pain, nausea, vomiting or diarrhea Dermatological: negative for rash Neurologic: negative for headache   Physical Exam: Blood pressure 143/87, pulse 114, temperature 98.5 F (36.9 C), temperature source Oral, resp. rate 16, height 5\' 3"  (1.6 m), weight 143 lb 12.8 oz (65.227 kg), last menstrual period 04/11/2013, SpO2 98.00%., Body mass index is 25.48 kg/(m^2). General: Well developed, well nourished, in no acute distress. Head: Normocephalic, atraumatic, eyes without discharge, sclera non-icteric, nares are congested. Bilateral auditory canals clear, TM's are without perforation, pearly grey with reflective cone of light bilaterally. Serous effusion bilaterally behind TM's. Maxillary sinus TTP. Oral cavity moist, dentition normal. Posterior pharynx with post nasal drip and mild erythema. No peritonsillar abscess or tonsillar exudate. Neck: Supple. No thyromegaly. Full ROM. No lymphadenopathy. Lungs: Clear bilaterally to auscultation without wheezes, rales, or rhonchi. Breathing is unlabored.  Heart: RRR with S1 S2. No murmurs, rubs, or gallops appreciated. Msk:  Strength and tone normal for age. Extremities: No clubbing or cyanosis. No edema. Neuro: Alert and oriented X 3. Moves all extremities spontaneously. CNII-XII grossly in tact. Psych:  Responds to questions appropriately with a  normal affect.    ASSESSMENT AND PLAN:  27 y.o. year old female with sinusitis -Sinusitis - Plan: azithromycin (ZITHROMAX Z-PAK) 250 MG tablet, methylPREDNISolone acetate (DEPO-MEDROL) injection 80 mg advised to quit smoking   -Tylenol/Motrin prn -Rest/fluids -RTC  precautions -RTC 3-5 days if no improvement  Signed, Elvina Sidle, MD 05/05/2013 8:44 AM

## 2013-07-15 LAB — OB RESULTS CONSOLE RUBELLA ANTIBODY, IGM: Rubella: IMMUNE

## 2013-07-15 LAB — OB RESULTS CONSOLE RPR: RPR: NONREACTIVE

## 2013-07-15 LAB — OB RESULTS CONSOLE HEPATITIS B SURFACE ANTIGEN: Hepatitis B Surface Ag: NEGATIVE

## 2013-07-15 LAB — OB RESULTS CONSOLE HIV ANTIBODY (ROUTINE TESTING): HIV: NONREACTIVE

## 2013-07-25 LAB — OB RESULTS CONSOLE GC/CHLAMYDIA
Chlamydia: NEGATIVE
Gonorrhea: NEGATIVE

## 2013-12-12 NOTE — L&D Delivery Note (Signed)
Delivery Note At 9:58 PM a viable and healthy female was delivered via Vaginal, Spontaneous Delivery (Presentation: Left Occiput Anterior).  APGAR: 8, 9; weight pending   Placenta status: Intact, Spontaneous. Not sent  Cord: 3 vessels with the following complications: Short.  Cord pH: none  Anesthesia: Epidural Local  Episiotomy: None Lacerations: 2nd degree;Perineal, subclitoral Suture Repair: 3.0 chromic Est. Blood Loss (mL): 250  Mom to postpartum.  Baby to Couplet care / Skin to Skin.  Tashiya Souders A 02/24/2014, 10:55 PM

## 2014-01-30 LAB — OB RESULTS CONSOLE GBS: STREP GROUP B AG: POSITIVE

## 2014-02-10 ENCOUNTER — Encounter (HOSPITAL_COMMUNITY): Payer: Self-pay

## 2014-02-10 ENCOUNTER — Inpatient Hospital Stay (HOSPITAL_COMMUNITY)
Admission: AD | Admit: 2014-02-10 | Discharge: 2014-02-10 | Disposition: A | Discharge status: Home / Self Care | Payer: BC Managed Care – PPO | Source: Ambulatory Visit | Attending: Obstetrics and Gynecology | Admitting: Obstetrics and Gynecology

## 2014-02-10 DIAGNOSIS — M549 Dorsalgia, unspecified: Secondary | ICD-10-CM | POA: Insufficient documentation

## 2014-02-10 DIAGNOSIS — O99891 Other specified diseases and conditions complicating pregnancy: Secondary | ICD-10-CM | POA: Insufficient documentation

## 2014-02-10 DIAGNOSIS — O9989 Other specified diseases and conditions complicating pregnancy, childbirth and the puerperium: Principal | ICD-10-CM

## 2014-02-10 DIAGNOSIS — R109 Unspecified abdominal pain: Secondary | ICD-10-CM | POA: Insufficient documentation

## 2014-02-10 LAB — URINE MICROSCOPIC-ADD ON

## 2014-02-10 LAB — URINALYSIS, ROUTINE W REFLEX MICROSCOPIC
Bilirubin Urine: NEGATIVE
Glucose, UA: NEGATIVE mg/dL
Hgb urine dipstick: NEGATIVE
KETONES UR: NEGATIVE mg/dL
NITRITE: NEGATIVE
PH: 6 (ref 5.0–8.0)
PROTEIN: NEGATIVE mg/dL
Specific Gravity, Urine: 1.025 (ref 1.005–1.030)
Urobilinogen, UA: 0.2 mg/dL (ref 0.0–1.0)

## 2014-02-10 NOTE — MAU Note (Signed)
Patient states she started having left side pain that radiates to the left back for about 3 days. Got worse and woke her up this am. States good fetal movement. Denies bleeding or leaking.

## 2014-02-10 NOTE — Discharge Instructions (Signed)

## 2014-02-10 NOTE — MAU Provider Note (Signed)
History   cc: left groin pain H/a x 4 days  Chief Complaint  Patient presents with  . Abdominal Pain   28 yo [id number] MBF @ 38 3/[redacted] weeks gestation presents with c/o left groin pain and h/a x 4 days. H/a is not present currently and had used tylenol for it. Denied light sensitivity or visual  Changes. (+) FM no vaginal bleeding   OB History   Grav Para Term Preterm Abortions TAB SAB Ect Mult Living   3 1 1       1       Past Medical History  Diagnosis Date  . Ectopic pregnancy without intrauterine pregnancy 2013  . Allergy   . Arthritis   . GERD (gastroesophageal reflux disease)   . Asthma     Past Surgical History  Procedure Laterality Date  . No past surgeries    . Laparoscopy  11/18/2012    Procedure: LAPAROSCOPY OPERATIVE;  Surgeon: Serita Kyle, MD;  Location: [facility];  Service: Gynecology;  Laterality: N/A;  Operative Laparoscopy with removal of left fallopian tube and ectopic pregnancy, Dilation and evacuation  . Dilation and curettage of uterus  11/18/2012    Procedure: DILATATION AND CURETTAGE;  Surgeon: Serita Kyle, MD;  Location: [facility];  Service: Gynecology;  Laterality: N/A;  . Falopian tube removed  December 2013    had a tubal pregnancy    Family History  Problem Relation Age of Onset  . Other Neg Hx   . Asthma Mother   . Obesity Mother   . Diabetes Father   . Hypertension Father   . Asthma Brother   . Asthma Daughter   . Cancer Maternal Grandmother   . Diabetes Paternal Grandmother   . Hypertension Paternal Grandmother   . Diabetes Paternal Grandfather   . Hypertension Paternal Grandfather     History  Substance Use Topics  . Smoking status: Current Every Day Smoker    Types: Cigarettes  . Smokeless tobacco: Not on file  . Alcohol Use: No    Allergies:  Allergies  Allergen Reactions  . Augmentin [Amoxicillin-Pot Clavulanate]     Severe yeast infection  . Cefdinir Diarrhea    diarrhea    Prescriptions prior to admission   Medication Sig Dispense Refill  . Doxylamine-Pyridoxine (DICLEGIS) 10-10 MG TBEC Take 1-2 tablets by mouth as needed (takes 1 tablet during the day and 2 tablets at night for nausea).      Marland Kitchen Fexofenadine HCl (ALLEGRA PO) Take 1 tablet by mouth daily.      . pantoprazole (PROTONIX) 40 MG tablet Take 40 mg by mouth daily.      . Prenatal Vit-Fe Fumarate-FA (PRENATAL MULTIVITAMIN) TABS tablet Take 2 tablets by mouth daily at 12 noon.      Marland Kitchen albuterol (PROVENTIL HFA;VENTOLIN HFA) 108 (90 BASE) MCG/ACT inhaler Inhale 2 puffs into the lungs every 6 (six) hours as needed for wheezing.  1 Inhaler  10     Physical Exam   Height 5\' 2"  (1.575 m), weight 93.895 kg (207 lb).  General appearance: alert, cooperative and no distress Lungs: clear to auscultation bilaterally Abdomen: gravid nontender Pelvic: closed/vtx /-4/50% Extremities: no edema, redness or tenderness in the calves or thighs  Tracing: baseline 135 (+) accels 160-170  Ctx q 2-4 mins  ED Course  IMP: Ligament pain or gas pocket no evidence of labor IUP @ 38 3/[redacted] weeks gestation  P) d/c home . Labor precaution. May use heat and tylenol for  left side. Keep scheduled OB appt  MDM   Eleaner Dibartolo A, MD 7:49 AM 02/10/2014

## 2014-02-21 ENCOUNTER — Other Ambulatory Visit: Payer: Self-pay | Admitting: Obstetrics and Gynecology

## 2014-02-24 ENCOUNTER — Inpatient Hospital Stay (HOSPITAL_COMMUNITY): Payer: BC Managed Care – PPO | Admitting: Anesthesiology

## 2014-02-24 ENCOUNTER — Encounter (HOSPITAL_COMMUNITY): Payer: Self-pay | Admitting: *Deleted

## 2014-02-24 ENCOUNTER — Encounter (HOSPITAL_COMMUNITY): Payer: BC Managed Care – PPO | Admitting: Anesthesiology

## 2014-02-24 ENCOUNTER — Inpatient Hospital Stay (HOSPITAL_COMMUNITY)
Admission: AD | Admit: 2014-02-24 | Discharge: 2014-02-26 | DRG: 775 | Disposition: A | Discharge status: Home / Self Care | Payer: BC Managed Care – PPO | Source: Ambulatory Visit | Attending: Obstetrics and Gynecology | Admitting: Obstetrics and Gynecology

## 2014-02-24 DIAGNOSIS — Z22330 Carrier of Group B streptococcus: Secondary | ICD-10-CM

## 2014-02-24 DIAGNOSIS — O99892 Other specified diseases and conditions complicating childbirth: Secondary | ICD-10-CM | POA: Diagnosis present

## 2014-02-24 DIAGNOSIS — O9903 Anemia complicating the puerperium: Secondary | ICD-10-CM | POA: Diagnosis not present

## 2014-02-24 DIAGNOSIS — Z87891 Personal history of nicotine dependence: Secondary | ICD-10-CM

## 2014-02-24 DIAGNOSIS — O9989 Other specified diseases and conditions complicating pregnancy, childbirth and the puerperium: Secondary | ICD-10-CM

## 2014-02-24 DIAGNOSIS — K219 Gastro-esophageal reflux disease without esophagitis: Secondary | ICD-10-CM | POA: Diagnosis present

## 2014-02-24 DIAGNOSIS — D62 Acute posthemorrhagic anemia: Secondary | ICD-10-CM | POA: Diagnosis not present

## 2014-02-24 HISTORY — DX: Headache: R51

## 2014-02-24 HISTORY — DX: Calculus of kidney: N20.0

## 2014-02-24 LAB — CBC
HCT: 34.1 % — ABNORMAL LOW (ref 36.0–46.0)
HEMOGLOBIN: 11.9 g/dL — AB (ref 12.0–15.0)
MCH: 29.5 pg (ref 26.0–34.0)
MCHC: 34.9 g/dL (ref 30.0–36.0)
MCV: 84.4 fL (ref 78.0–100.0)
Platelets: 179 10*3/uL (ref 150–400)
RBC: 4.04 MIL/uL (ref 3.87–5.11)
RDW: 13.6 % (ref 11.5–15.5)
WBC: 13.1 10*3/uL — ABNORMAL HIGH (ref 4.0–10.5)

## 2014-02-24 LAB — TYPE AND SCREEN
ABO/RH(D): B POS
Antibody Screen: NEGATIVE

## 2014-02-24 MED ORDER — DIPHENHYDRAMINE HCL 25 MG PO CAPS: 25.0000 mg | ORAL_CAPSULE | Freq: Four times a day (QID) | ORAL | Status: DC | PRN

## 2014-02-24 MED ORDER — LANOLIN HYDROUS EX OINT: TOPICAL_OINTMENT | CUTANEOUS | Status: DC | PRN

## 2014-02-24 MED ORDER — FENTANYL 2.5 MCG/ML BUPIVACAINE 1/10 % EPIDURAL INFUSION (WH - ANES)
14.0000 mL/h | INTRAMUSCULAR | Status: DC | PRN
  Administered 2014-02-24 (×2): 14 mL/h via EPIDURAL
  Filled 2014-02-24 (×2): qty 125

## 2014-02-24 MED ORDER — LIDOCAINE HCL (PF) 1 % IJ SOLN
INTRAMUSCULAR | Status: DC | PRN
  Administered 2014-02-24 (×2): 5 mL

## 2014-02-24 MED ORDER — OXYTOCIN 40 UNITS IN LACTATED RINGERS INFUSION - SIMPLE MED
1.0000 m[IU]/min | INTRAVENOUS | Status: DC
  Administered 2014-02-24: 2 m[IU]/min via INTRAVENOUS
  Filled 2014-02-24: qty 1000

## 2014-02-24 MED ORDER — OXYTOCIN BOLUS FROM INFUSION: 500.0000 mL | INTRAVENOUS | Status: DC

## 2014-02-24 MED ORDER — LACTATED RINGERS IV SOLN
INTRAVENOUS | Status: DC
  Administered 2014-02-24: 12:00:00 via INTRAVENOUS

## 2014-02-24 MED ORDER — PENICILLIN G POTASSIUM 5000000 UNITS IJ SOLR: 5.0000 10*6.[IU] | Freq: Once | INTRAVENOUS | Status: DC

## 2014-02-24 MED ORDER — PENICILLIN G POTASSIUM 5000000 UNITS IJ SOLR: 2.5000 10*6.[IU] | INTRAVENOUS | Status: DC

## 2014-02-24 MED ORDER — PHENYLEPHRINE 40 MCG/ML (10ML) SYRINGE FOR IV PUSH (FOR BLOOD PRESSURE SUPPORT)
80.0000 ug | PREFILLED_SYRINGE | INTRAVENOUS | Status: DC | PRN
Start: 2014-02-24 — End: 2014-02-24
  Filled 2014-02-24: qty 2

## 2014-02-24 MED ORDER — IBUPROFEN 600 MG PO TABS
600.0000 mg | ORAL_TABLET | Freq: Four times a day (QID) | ORAL | Status: DC
  Administered 2014-02-25 – 2014-02-26 (×6): 600 mg via ORAL
  Filled 2014-02-24 (×6): qty 1

## 2014-02-24 MED ORDER — DIBUCAINE 1 % RE OINT: 1.0000 "application " | TOPICAL_OINTMENT | RECTAL | Status: DC | PRN

## 2014-02-24 MED ORDER — ONDANSETRON HCL 4 MG/2ML IJ SOLN: 4.0000 mg | Freq: Four times a day (QID) | INTRAMUSCULAR | Status: DC | PRN

## 2014-02-24 MED ORDER — LIDOCAINE HCL (PF) 1 % IJ SOLN
30.0000 mL | INTRAMUSCULAR | Status: DC | PRN
  Administered 2014-02-24: 30 mL via SUBCUTANEOUS
  Filled 2014-02-24: qty 30

## 2014-02-24 MED ORDER — IBUPROFEN 600 MG PO TABS: 600.0000 mg | ORAL_TABLET | Freq: Four times a day (QID) | ORAL | Status: DC | PRN

## 2014-02-24 MED ORDER — ONDANSETRON HCL 4 MG/2ML IJ SOLN: 4.0000 mg | INTRAMUSCULAR | Status: DC | PRN

## 2014-02-24 MED ORDER — ACETAMINOPHEN 325 MG PO TABS: 650.0000 mg | ORAL_TABLET | ORAL | Status: DC | PRN

## 2014-02-24 MED ORDER — SENNOSIDES-DOCUSATE SODIUM 8.6-50 MG PO TABS
2.0000 | ORAL_TABLET | ORAL | Status: DC
  Administered 2014-02-25: 2 via ORAL
  Filled 2014-02-24 (×2): qty 2

## 2014-02-24 MED ORDER — LACTATED RINGERS IV SOLN
500.0000 mL | Freq: Once | INTRAVENOUS | Status: AC
  Administered 2014-02-24: 500 mL via INTRAVENOUS

## 2014-02-24 MED ORDER — SIMETHICONE 80 MG PO CHEW: 80.0000 mg | CHEWABLE_TABLET | ORAL | Status: DC | PRN

## 2014-02-24 MED ORDER — PRENATAL MULTIVITAMIN CH
1.0000 | ORAL_TABLET | Freq: Every day | ORAL | Status: DC
  Administered 2014-02-25: 1 via ORAL
  Filled 2014-02-24: qty 1

## 2014-02-24 MED ORDER — CITRIC ACID-SODIUM CITRATE 334-500 MG/5ML PO SOLN: 30.0000 mL | ORAL | Status: DC | PRN

## 2014-02-24 MED ORDER — EPHEDRINE 5 MG/ML INJ
10.0000 mg | INTRAVENOUS | Status: DC | PRN
  Filled 2014-02-24: qty 4
  Filled 2014-02-24: qty 2

## 2014-02-24 MED ORDER — OXYCODONE-ACETAMINOPHEN 5-325 MG PO TABS
1.0000 | ORAL_TABLET | ORAL | Status: DC | PRN
  Administered 2014-02-25 (×2): 1 via ORAL
  Filled 2014-02-24 (×2): qty 1

## 2014-02-24 MED ORDER — OXYCODONE-ACETAMINOPHEN 5-325 MG PO TABS: 1.0000 | ORAL_TABLET | ORAL | Status: DC | PRN

## 2014-02-24 MED ORDER — WITCH HAZEL-GLYCERIN EX PADS: 1.0000 "application " | MEDICATED_PAD | CUTANEOUS | Status: DC | PRN

## 2014-02-24 MED ORDER — EPHEDRINE 5 MG/ML INJ
10.0000 mg | INTRAVENOUS | Status: DC | PRN
  Filled 2014-02-24: qty 2

## 2014-02-24 MED ORDER — PHENYLEPHRINE 40 MCG/ML (10ML) SYRINGE FOR IV PUSH (FOR BLOOD PRESSURE SUPPORT)
80.0000 ug | PREFILLED_SYRINGE | INTRAVENOUS | Status: DC | PRN
  Filled 2014-02-24: qty 10
  Filled 2014-02-24: qty 2

## 2014-02-24 MED ORDER — PENICILLIN G POTASSIUM 5000000 UNITS IJ SOLR
5.0000 10*6.[IU] | Freq: Once | INTRAVENOUS | Status: AC
  Administered 2014-02-24: 5 10*6.[IU] via INTRAVENOUS
  Filled 2014-02-24: qty 5

## 2014-02-24 MED ORDER — OXYTOCIN 40 UNITS IN LACTATED RINGERS INFUSION - SIMPLE MED
62.5000 mL/h | INTRAVENOUS | Status: DC
  Administered 2014-02-24: 62.5 mL/h via INTRAVENOUS

## 2014-02-24 MED ORDER — PENICILLIN G POTASSIUM 5000000 UNITS IJ SOLR
2.5000 10*6.[IU] | INTRAVENOUS | Status: DC
  Administered 2014-02-24: 2.5 10*6.[IU] via INTRAVENOUS
  Filled 2014-02-24 (×4): qty 2.5

## 2014-02-24 MED ORDER — FERROUS SULFATE 325 (65 FE) MG PO TABS
325.0000 mg | ORAL_TABLET | Freq: Two times a day (BID) | ORAL | Status: DC
  Administered 2014-02-25 – 2014-02-26 (×3): 325 mg via ORAL
  Filled 2014-02-24 (×3): qty 1

## 2014-02-24 MED ORDER — DIPHENHYDRAMINE HCL 50 MG/ML IJ SOLN: 12.5000 mg | INTRAMUSCULAR | Status: DC | PRN

## 2014-02-24 MED ORDER — BENZOCAINE-MENTHOL 20-0.5 % EX AERO
1.0000 "application " | INHALATION_SPRAY | CUTANEOUS | Status: DC | PRN
  Administered 2014-02-25: 1 via TOPICAL
  Filled 2014-02-24: qty 56

## 2014-02-24 MED ORDER — ONDANSETRON HCL 4 MG PO TABS: 4.0000 mg | ORAL_TABLET | ORAL | Status: DC | PRN

## 2014-02-24 MED ORDER — LACTATED RINGERS IV SOLN: 500.0000 mL | INTRAVENOUS | Status: DC | PRN

## 2014-02-24 MED ORDER — CLINDAMYCIN PHOSPHATE 900 MG/50ML IV SOLN
900.0000 mg | Freq: Once | INTRAVENOUS | Status: AC
  Administered 2014-02-24: 900 mg via INTRAVENOUS
  Filled 2014-02-24: qty 50

## 2014-02-24 MED ORDER — ZOLPIDEM TARTRATE 5 MG PO TABS: 5.0000 mg | ORAL_TABLET | Freq: Every evening | ORAL | Status: DC | PRN

## 2014-02-24 MED ORDER — OXYTOCIN 10 UNIT/ML IJ SOLN: 10.0000 [IU] | Freq: Once | INTRAMUSCULAR | Status: DC

## 2014-02-24 NOTE — Progress Notes (Signed)
S: pushed for 1/2 hour  O:  Pitocin 4 miu VE fully (+) caput (+) 2 station  Tracing: baseline 135 (+) early decels. Some late decel noted Ctx q 2-3 mins   A/P: complete LOP GBS cx (+) P) exaggerated  Right sims. Labor vtx down further

## 2014-02-24 NOTE — MAU Note (Signed)
Patient states she is having contractions every 3 minutes with bloody show. Reports good fetal movement.

## 2014-02-24 NOTE — Anesthesia Preprocedure Evaluation (Signed)
Anesthesia Evaluation  Patient identified by MRN, date of birth, ID band Patient awake    Reviewed: Allergy & Precautions, H&P , Patient's Chart, lab work & pertinent test results  Airway Mallampati: II TM Distance: >3 FB Neck ROM: full    Dental   Pulmonary asthma , former smoker,  breath sounds clear to auscultation        Cardiovascular Rhythm:regular Rate:Normal     Neuro/Psych  Headaches,    GI/Hepatic GERD-  ,  Endo/Other    Renal/GU      Musculoskeletal   Abdominal   Peds  Hematology   Anesthesia Other Findings   Reproductive/Obstetrics (+) Pregnancy                           Anesthesia Physical Anesthesia Plan  ASA: II  Anesthesia Plan: Epidural   Post-op Pain Management:    Induction:   Airway Management Planned:   Additional Equipment:   Intra-op Plan:   Post-operative Plan:   Informed Consent: I have reviewed the patients History and Physical, chart, labs and discussed the procedure including the risks, benefits and alternatives for the proposed anesthesia with the patient or authorized representative who has indicated his/her understanding and acceptance.     Plan Discussed with:   Anesthesia Plan Comments:         Anesthesia Quick Evaluation

## 2014-02-24 NOTE — Anesthesia Procedure Notes (Signed)
Epidural Patient location during procedure: OB Start time: 02/24/2014 1:03 PM  Staffing Anesthesiologist: Brayton CavesJACKSON, Anysia Choi Performed by: anesthesiologist   Preanesthetic Checklist Completed: patient identified, site marked, surgical consent, pre-op evaluation, timeout performed, IV checked, risks and benefits discussed and monitors and equipment checked  Epidural Patient position: sitting Prep: site prepped and draped and DuraPrep Patient monitoring: continuous pulse ox and blood pressure Approach: midline Injection technique: LOR air  Needle:  Needle type: Tuohy  Needle gauge: 17 G Needle length: 9 cm and 9 Needle insertion depth: 6 cm Catheter type: closed end flexible Catheter size: 19 Gauge Catheter at skin depth: 10 cm Test dose: negative  Assessment Events: blood not aspirated, injection not painful, no injection resistance, negative IV test and no paresthesia  Additional Notes Patient identified.  Risk benefits discussed including failed block, incomplete pain control, headache, nerve damage, paralysis, blood pressure changes, nausea, vomiting, reactions to medication both toxic or allergic, and postpartum back pain.  Patient expressed understanding and wished to proceed.  All questions were answered.  Sterile technique used throughout procedure and epidural site dressed with sterile barrier dressing. No paresthesia or other complications noted.The patient did not experience any signs of intravascular injection such as tinnitus or metallic taste in mouth nor signs of intrathecal spread such as rapid motor block. Please see nursing notes for vital signs.

## 2014-02-24 NOTE — MAU Note (Signed)
Started this morning, getting closer and stronger, small amt of bloody mucous this morning, no water leaking. Was 1 when last checked.  No problems with preg

## 2014-02-24 NOTE — Progress Notes (Signed)
Diane [name] is a 28 y.o. G3P1011 at 9366w2d by LMP admitted for active labor  Subjective: Chief Complaint  Patient presents with  . Labor Eval   C/o hungry and tired.  Notes pressure w/ ctx Pitocin 4 MIU Objective: BP 148/92  Pulse 111  Temp(Src) 98.4 F (36.9 C) (Oral)  Resp 18  Ht 5\' 2"  (1.575 m)  Wt 93.441 kg (206 lb)  BMI 37.67 kg/m2  SpO2 100% I/O last 3 completed shifts: In: -  Out: 1000 [Urine:1000] Total I/O In: -  Out: 150 [Urine:150]  FHT:  FHR: 140 bpm, variability: moderate,  accelerations:  Present,  decelerations:  Present variable w/ ctx UC:   regular, every 2- 2 1/2 minutes SVE:   10 cm dilated, 100% effaced, +2 w/ caput station, cervical position LOP   Labs: Lab Results  Component Value Date   WBC 13.1* 02/24/2014   HGB 11.9* 02/24/2014   HCT 34.1* 02/24/2014   MCV 84.4 02/24/2014   PLT 179 02/24/2014    Assessment / Plan: complete  GBS cx (+) Posterior presentation P) resume pushing. If no further descent in one hour proceed with C/S. Cont IV PCN   Anticipated MOD:  unknown  Diane [name] A 02/24/2014, 9:40 PM

## 2014-02-24 NOTE — H&P (Signed)
Diane [name] is [name] y.o. female presenting for admission due to active labor. Intact membrane. GBS cx (+)  Polyhydramnios resolved on sono done 3/13 Maternal Medical History:  Reason for admission: Contractions.   Contractions: Onset was 6-12 hours ago.   Frequency: irregular.   Perceived severity is moderate.    Fetal activity: Perceived fetal activity is normal.    Prenatal Complications - Diabetes: none.    OB History   Grav Para Term Preterm Abortions TAB SAB Ect Mult Living   3 1 1  1   1  1      Past Medical History  Diagnosis Date  . Ectopic pregnancy without intrauterine pregnancy 2013  . Allergy   . GERD (gastroesophageal reflux disease)   . Headache(784.0)   . Kidney stone 2009  . Arthritis     knees bilaterally  . Asthma     coincides with respiratory illness   Past Surgical History  Procedure Laterality Date  . Laparoscopy  11/18/2012    Procedure: LAPAROSCOPY OPERATIVE;  Surgeon: Diane [name] Diane Squier, MD;  Location: [facility];  Service: Gynecology;  Laterality: N/[name];  Operative Laparoscopy with removal of left fallopian tube and ectopic pregnancy, Dilation and evacuation  . Dilation and curettage of uterus  11/18/2012    Procedure: DILATATION AND CURETTAGE;  Surgeon: Diane [name] Diane Pendell, MD;  Location: [facility];  Service: Gynecology;  Laterality: N/[name];  . Falopian tube removed  December 2013    had [name] tubal pregnancy   Family History: family history includes Asthma in her brother, daughter, and mother; Cancer in her maternal grandmother; Diabetes in her father, paternal grandfather, and paternal grandmother; Hearing loss in her paternal grandfather; Hypertension in her father, paternal grandfather, and paternal grandmother; Obesity in her mother. There is no history of Other. Social History:  reports that she has quit smoking. Her smoking use included Cigarettes. She smoked 0.00 packs per day. She has never used smokeless tobacco. She reports that she does not drink  alcohol or use illicit drugs.   Prenatal Transfer Tool  Maternal Diabetes: No Genetic Screening: Normal Maternal Ultrasounds/Referrals: Normal Fetal Ultrasounds or other Referrals:  None Maternal Substance Abuse:  No Significant Maternal Medications:  None Significant Maternal Lab Results:  Lab values include: Group B Strep positive Other Comments:  polyhydramnios resolved  ROS neg  Dilation: 4 Effacement (%): 70;80 Station: -3 Exam by:: Dr Diane [name] Blood pressure 145/81, pulse 81, temperature 97.9 F (36.6 C), temperature source Oral, resp. rate 18, height 5\' 2"  (1.575 m), weight 93.441 kg (206 lb), SpO2 100.00%. Exam Physical Exam  Constitutional: She is oriented to person, place, and time. She appears well-developed and well-nourished.  Eyes: EOM are normal.  Neck: Neck supple.  Cardiovascular: Regular rhythm.   Respiratory: Breath sounds normal.  GI: Soft. Bowel sounds are normal.  Musculoskeletal: She exhibits no edema.  Neurological: She is alert and oriented to person, place, and time. She has normal reflexes.  Skin: Skin is warm and dry.  Psychiatric: She has [name] normal mood and affect.    Prenatal labs: ABO, Rh: --/--/B POS (03/16 1145) Antibody: NEG (03/16 1145) Rubella: Immune (08/04 0000) RPR: Nonreactive (08/04 0000)  HBsAg: Negative (08/04 0000)  HIV: Non-reactive (08/04 0000)  GBS: Positive (02/19 0000)   Assessment/Plan: Active labor GBS cx (+)  Term gestation P) admit routine labs. GBS cx (+) prophylaxis. Epidural. Amniotomy. Pitocin IV prn   Diane [name] 02/24/2014, 2:10 PM

## 2014-02-24 NOTE — Progress Notes (Signed)
S: called for FHR deceleration x 8 mins  O: on arrival. Maternal O2 on. Pitocin off . VE done by 5 cm /90/high  Intact.   Tracing: baseline 125 ? Ctx frequency  IMP: Fetal heart rate deceleration ? Related to cord P) monitor w/o pitocin for 1/2 hour. Will do controlled amniotomy when feasible

## 2014-02-25 ENCOUNTER — Encounter (HOSPITAL_COMMUNITY): Payer: Self-pay

## 2014-02-25 LAB — CBC
HCT: 28.7 % — ABNORMAL LOW (ref 36.0–46.0)
Hemoglobin: 9.9 g/dL — ABNORMAL LOW (ref 12.0–15.0)
MCH: 29.3 pg (ref 26.0–34.0)
MCHC: 34.5 g/dL (ref 30.0–36.0)
MCV: 84.9 fL (ref 78.0–100.0)
Platelets: 155 10*3/uL (ref 150–400)
RBC: 3.38 MIL/uL — AB (ref 3.87–5.11)
RDW: 13.7 % (ref 11.5–15.5)
WBC: 19 10*3/uL — ABNORMAL HIGH (ref 4.0–10.5)

## 2014-02-25 LAB — RPR: RPR Ser Ql: NONREACTIVE

## 2014-02-25 NOTE — Progress Notes (Signed)
Patient ID: Diane [name], female   DOB: 04/17/1986, 28 y.o.   MRN: [id number] PPD # 1  Subjective: Pt reports feeling sore, but well / Pain controlled with ibuprofen Tolerating po/ Voiding without problems/ No n/v Bleeding is moderate Newborn info:  Information for the patient's newborn:  Judeth CornfieldOneal, Boy Fumiko [[id number]][[id number]]  female  / circ to be performed today by Dr Cherly Hensenousins / Feeding: breast   Objective:  VS: Blood pressure 140/54, pulse 85, temperature 98.2 F (36.8 C), temperature source Oral, resp. rate 18.    Recent Labs  02/24/14 1145 02/25/14 0611  WBC 13.1* 19.0*  HGB 11.9* 9.9*  HCT 34.1* 28.7*  PLT 179 155    Blood type: B POS Rubella: Immune    Physical Exam:  General:  alert, cooperative and no distress CV: Regular rate and rhythm Resp: clear Abdomen: soft, nontender, normal bowel sounds Uterine Fundus: firm, below umbilicus, nontender Perineum: healing with good reapproximation and mild edema Lochia: moderate Ext: edema trace and Homans sign is negative, no sign of DVT   A/P: PPD # 1/ G3P2012/ S/P: SVD w/ 2nd deg repair ABL Anemia compounded by chronic anemia Doing well Continue routine post partum orders Anticipate D/C home in AM    Demetrius RevelFISHER,Lakecia Deschamps K, MSN, [facility] 02/25/2014, 8:59 AM

## 2014-02-25 NOTE — Anesthesia Postprocedure Evaluation (Signed)
  Anesthesia Post-op Note  Patient: Diane [name]  Procedure(s) Performed: * No procedures listed *  Patient Location: PACU and Mother/Baby  Anesthesia Type:Epidural  Level of Consciousness: awake, alert  and oriented  Airway and Oxygen Therapy: Patient Spontanous Breathing  Post-op Pain: none  Post-op Assessment: Post-op Vital signs reviewed, Patient's Cardiovascular Status Stable, No headache, No backache, No residual numbness and No residual motor weakness  Post-op Vital Signs: Reviewed and stable  Complications: No apparent anesthesia complications

## 2014-02-25 NOTE — Lactation Note (Signed)
This note was copied from the chart of Diane [name]. Lactation Consultation Note  Patient Name: Diane [name] Today's Date: 02/25/2014 Reason for consult: Initial assessment (added #24 Nipple shiled for latching , see lC note ) Per mom baby has been sleepy , during the consult baby had a large transitional stool , LC changed diaper.  Assisted with latch and check baby sucking and noted baby humping tongue,also a high palate. LC noted mom to have large breast with flat nipples , semi compressible areolas , showing mom how to roll  nipple after breast massage , hand express, and the nipple more erect . Attempted latch in football position , working on depth , baby opens wide, able to latch over the nipple and slides  Off the the nipple , added a #24 Nipple shield ( showing mom how to apply nipple shield ) , abby latched with depth  And a few strong sucks and was able to keep the depth. After a few strong sucks baby released , no colostrum noted  on the nipple shield.Mom has been using formula with a artifical nipples ( per mom up to 10ml ) ,LC mentioned she can  either instill EBM or formula with a curved tip syringe . ( demo curved tip syringe ). Also at consult set up a DEBP with  Instructions , LC recommended increase flange to #27 and post pump for 10 -15 mins and save milk. Baby skin to skin next to mom, Sound asleep. Mom aware she can call back when baby showing stronger signs of feeding cues. Mom aware of the [facility] and [facility]. Mom also mentioned she has [employer] insurance , LC recommended calling and arranging  For a DEBP.      Maternal Data Formula Feeding for Exclusion: No Infant to breast within first hour of birth: Yes Has patient been taught Hand Expression?: Yes Does the patient have breastfeeding experience prior to this delivery?: Yes  Feeding Feeding Type: Breast Fed Length of feed: 5 min  LATCH Score/Interventions Latch: Grasps breast easily,  tongue down, lips flanged, rhythmical sucking. (depth able to achieved ) Intervention(s): Skin to skin;Teach feeding cues;Waking techniques Intervention(s): Adjust position;Assist with latch;Breast compression;Breast massage  Audible Swallowing: None  Type of Nipple: Everted at rest and after stimulation (with aid of the #24 NS )  Comfort (Breast/Nipple): Soft / non-tender     Hold (Positioning): Assistance needed to correctly position infant at breast and maintain latch. Intervention(s): Breastfeeding basics reviewed;Support Pillows;Position options;Skin to skin  LATCH Score: 7  Lactation Tools Discussed/Used Tools: Nipple Dorris CarnesShields;Pump (curved tip syringe ) Nipple shield size: 24 Breast pump type: Double-Electric Breast Pump (added for post pumping , hand piump for prepumping ) Pump Review: Setup, frequency, and cleaning Initiated by:: MAI  Date initiated:: 02/25/14   Consult Status Consult Status: Follow-up Date: 02/26/14 Follow-up type: In-patient    Diane [name], Diane [name] 02/25/2014, 3:00 PM

## 2014-02-26 ENCOUNTER — Inpatient Hospital Stay (HOSPITAL_COMMUNITY): Admission: RE | Admit: 2014-02-26 | Payer: BC Managed Care – PPO | Source: Ambulatory Visit

## 2014-02-26 MED ORDER — OXYCODONE-ACETAMINOPHEN 5-325 MG PO TABS: 1.0000 | ORAL_TABLET | ORAL | Status: DC | PRN

## 2014-02-26 MED ORDER — IBUPROFEN 600 MG PO TABS: 600.0000 mg | ORAL_TABLET | Freq: Four times a day (QID) | ORAL | Status: DC

## 2014-02-26 NOTE — Discharge Summary (Signed)
Obstetric Discharge Summary  Reason for Admission: onset of labor Prenatal Procedures: NST and ultrasound Intrapartum Procedures: spontaneous vaginal delivery and GBS prophylaxis Postpartum Procedures: none Complications-Operative and Postpartum: 2nd degree perineal laceration Hemoglobin  Date Value Ref Range Status  02/25/2014 9.9* 12.0 - 15.0 g/dL Final     REPEATED TO VERIFY     DELTA CHECK NOTED     HCT  Date Value Ref Range Status  02/25/2014 28.7* 36.0 - 46.0 % Final    Physical Exam:  General: alert, cooperative and no distress Lochia: appropriate Uterine Fundus: firm Incision: healing well DVT Evaluation: No evidence of DVT seen on physical exam.  Discharge Diagnoses: Term Pregnancy-delivered  Discharge Information: Date: 02/26/2014 Activity: pelvic rest Diet: routine Medications: PNV, Ibuprofen and Percocet Condition: stable Instructions: refer to practice specific booklet Discharge to: home Follow-up Information   Follow up with COUSINS,SHERONETTE A, MD. Schedule an appointment as soon as possible for a visit in 6 weeks. (postpartum exam)    Specialty:  Obstetrics and Gynecology   Contact information:   [address] [place] [id number] [phone]       Newborn Data: Live born female  Birth Weight: 7 lb 7.2 oz (3379 g) APGAR: 8, 9  Home with mother.  Marlinda MikeBAILEY, Samon Dishner 02/26/2014, 10:04 AM

## 2014-02-26 NOTE — Lactation Note (Signed)
This note was copied from the chart of Diane Haili Hammond. Lactation Consultation Note Follow up consult:  [name] 6136 hours old and sleeping after circ. Mother states she is still having difficulty latching baby but does not want assistance. Encouraged mother to make an outpatient appointment but she states she will call back to make appt. Mother currently using #24NS, formula feeding and occasionally pumping. Gave mother an extra nipple shield. Reviewed how to prefill NS to assist in latching baby and reviewed how to put on NS. Suggested she post pump 4-6 times a day for 15 minutes both breasts and give whatever is pumped back to baby. Provided volume guidelines sheet, reviewed supply and demand, lactation support services Encouraged mother to call if assistance is needed.    Patient Name: Diane [name] Today's Date: 02/26/2014 Reason for consult: Follow-up assessment   Maternal Data    Feeding    LATCH Score/Interventions                      Lactation Tools Discussed/Used     Consult Status Consult Status: Complete    Hardie PulleyBerkelhammer, Ruth Boschen 02/26/2014, 10:33 AM

## 2014-02-26 NOTE — Progress Notes (Signed)
PPD 2 SVD  S:  Reports feeling well - ready to go home             Tolerating po/ No nausea or vomiting             Bleeding is spotting             Pain controlled with motrin and percocet             Up ad lib / ambulatory / voiding QS  Newborn breast feeding  / Circumcision done  O:               VS: BP 110/67  Pulse 75  Temp(Src) 98.1 F (36.7 C) (Oral)  Resp 18  Ht 5\' 2"  (1.575 m)  Wt 93.441 kg (206 lb)  BMI 37.67 kg/m2  SpO2 100%  Breastfeeding? Unknown   LABS:              Recent Labs  02/24/14 1145 02/25/14 0611  WBC 13.1* 19.0*  HGB 11.9* 9.9*  PLT 179 155               Blood type: --/--/B POS (03/16 1145)  Rubella: Immune (08/04 0000)                     I&O: Intake/Output     03/17 0701 - 03/18 0700 03/18 0701 - 03/19 0700   Urine (mL/kg/hr)     Blood     Total Output       Net                          Physical Exam:             Alert and oriented X3  Lungs: Clear and unlabored  Heart: regular rate and rhythm / no mumurs  Abdomen: soft, non-tender, non-distended              Fundus: firm, non-tender, U-1  Perineum: mild edema  Lochia: light  Extremities: 1+ pedal edema, no calf pain or tenderness    A: PPD # 2   Doing well - stable status  P: Routine post partum orders  Dc home - WOB booklet with instructions reviewed  Marlinda MikeBAILEY, Anikin Prosser CNM, MSN, [facility] 02/26/2014, 10:02 AM

## 2014-09-18 ENCOUNTER — Inpatient Hospital Stay (HOSPITAL_COMMUNITY)
Admission: AD | Admit: 2014-09-18 | Discharge: 2014-09-18 | Discharge status: Against Medical Advice | Payer: BC Managed Care – PPO | Source: Ambulatory Visit | Attending: Obstetrics and Gynecology | Admitting: Obstetrics and Gynecology

## 2014-09-18 DIAGNOSIS — R109 Unspecified abdominal pain: Secondary | ICD-10-CM | POA: Diagnosis not present

## 2014-09-18 DIAGNOSIS — N939 Abnormal uterine and vaginal bleeding, unspecified: Secondary | ICD-10-CM | POA: Diagnosis not present

## 2014-09-18 LAB — URINE MICROSCOPIC-ADD ON

## 2014-09-18 LAB — URINALYSIS, ROUTINE W REFLEX MICROSCOPIC
Bilirubin Urine: NEGATIVE
Glucose, UA: NEGATIVE mg/dL
KETONES UR: NEGATIVE mg/dL
LEUKOCYTES UA: NEGATIVE
Nitrite: NEGATIVE
Protein, ur: NEGATIVE mg/dL
Specific Gravity, Urine: 1.025 (ref 1.005–1.030)
Urobilinogen, UA: 0.2 mg/dL (ref 0.0–1.0)
pH: 6 (ref 5.0–8.0)

## 2014-09-18 LAB — POCT PREGNANCY, URINE: Preg Test, Ur: NEGATIVE

## 2014-09-18 NOTE — MAU Note (Signed)
Patient states she has had a Nexplanon for about 5 1/2 months. Has had irregular bleeding since the placement, will spot for 2 weeks, heavy bleeding for 1 week then no bleeding for 1 week. Has been having low back back for about 2 weeks with no relief from pain medication. Has started having headaches and abdominal cramping.

## 2014-09-24 ENCOUNTER — Other Ambulatory Visit: Payer: Self-pay | Admitting: Family Medicine

## 2014-09-24 DIAGNOSIS — M545 Low back pain, unspecified: Secondary | ICD-10-CM

## 2014-09-24 DIAGNOSIS — N939 Abnormal uterine and vaginal bleeding, unspecified: Secondary | ICD-10-CM

## 2014-09-25 ENCOUNTER — Ambulatory Visit
Admission: RE | Admit: 2014-09-25 | Discharge: 2014-09-25 | Disposition: A | Discharge status: Home / Self Care | Payer: BC Managed Care – PPO | Source: Ambulatory Visit | Attending: Family Medicine | Admitting: Family Medicine

## 2014-09-25 DIAGNOSIS — M545 Low back pain, unspecified: Secondary | ICD-10-CM

## 2014-09-25 DIAGNOSIS — N939 Abnormal uterine and vaginal bleeding, unspecified: Secondary | ICD-10-CM

## 2014-10-13 ENCOUNTER — Encounter (HOSPITAL_COMMUNITY): Payer: Self-pay

## 2016-08-19 ENCOUNTER — Ambulatory Visit (INDEPENDENT_AMBULATORY_CARE_PROVIDER_SITE_OTHER): Payer: BC Managed Care – PPO | Admitting: Family Medicine

## 2016-08-19 VITALS — BP 122/72 | HR 86 | Temp 98.5°F | Resp 17 | Ht 63.0 in | Wt 183.0 lb

## 2016-08-19 DIAGNOSIS — R0982 Postnasal drip: Secondary | ICD-10-CM

## 2016-08-19 DIAGNOSIS — R0981 Nasal congestion: Secondary | ICD-10-CM | POA: Diagnosis not present

## 2016-08-19 DIAGNOSIS — M25571 Pain in right ankle and joints of right foot: Secondary | ICD-10-CM

## 2016-08-19 MED ORDER — FLUCONAZOLE 150 MG PO TABS: 150.0000 mg | ORAL_TABLET | Freq: Once | ORAL | 0 refills | Status: AC

## 2016-08-19 MED ORDER — FLUTICASONE PROPIONATE 50 MCG/ACT NA SUSP: 2.0000 | Freq: Every day | NASAL | 6 refills | Status: AC

## 2016-08-19 MED ORDER — AMOXICILLIN 875 MG PO TABS: 875.0000 mg | ORAL_TABLET | Freq: Two times a day (BID) | ORAL | 0 refills | Status: DC

## 2016-08-19 NOTE — Patient Instructions (Addendum)
Your sinus symptoms may be due to congestion and even possible allergies. Stop the Afrin nasal spray as that may cause rebound congestion. Start Flonase nasal spray 2 sprays per nostril each day, and saline or salt water nasal spray 3 or 4 times a day. If your sinus congestion and pressure is not improving into next week, and especially if discolored nasal discharge at that time, can start amoxicillin. Diflucan if needed for signs or symptoms of yeast infection.  Wear the ankle brace for the next 1 week, out of the brace at least once or twice a day for range of motion. If you are having increased pain or pain with putting weight on your ankle, return for recheck and x-rays. Otherwise follow-up with me in the next 10 days if not significantly improved.    IF you received an x-ray today, you will receive an invoice from [facility]. Please contact [facility] at [phone] with questions or concerns regarding your invoice.   IF you received labwork today, you will receive an invoice from [facility]. Please contact [facility] at [phone] with questions or concerns regarding your invoice.   Our billing staff will not be able to assist you with questions regarding bills from these companies.  You will be contacted with the lab results as soon as they are available. The fastest way to get your results is to activate your My Chart account. Instructions are located on the last page of this paperwork. If you have not heard from us regarding the results in 2 weeks, please contact this office.

## 2016-08-19 NOTE — Progress Notes (Signed)
Subjective:  By signing my name below, I, Stann Oresung-Kai Tsai, attest that this documentation has been prepared under the direction and in the presence of Meredith StaggersJeffrey Clio Gerhart, MD. Electronically Signed: Stann Oresung-Kai Tsai, Scribe. 08/19/2016 , 9:34 AM .  Patient was seen in Room 2 .   Patient ID: Diane [name], female    DOB: 09/04/1986, 30 y.o.   MRN: [id number] Chief Complaint  Patient presents with  . Ankle Pain  . Sinusitis   HPI Diane [name] is a 30 y.o. female  Here for right ankle pain and sinus congestion.  Patient states having right ankle pain starting 2 days ago that occurred out of the blue. She's taken ibuprofen 600mg  last night without any relief. She denies any swelling. She denies previous fractures. She denies any known twists, sprains or any other injuries to the area. She's been standing more as school has resumed.   She also mentions sinus pressure with cough and congestion that started about 2 weeks ago. She's taken Tylenol sinus and congestion initially and OTC Afrin nasal spray for 2 weeks. She has issues with allergies in the past and takes Allegra at night. She denies fever. She informs being sensitive on Augmentin which causes diarrhea and yeast infection. She's taken zpak and amoxicillin in the past.   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   Patient Active Problem List   Diagnosis Date Noted  . SVD (spontaneous vaginal delivery) 02/25/2014  . Postpartum care following vaginal delivery (02/24/14) 02/24/2014  . Ectopic pregnancy 11/18/2012   Past Medical History:  Diagnosis Date  . Allergy   . Arthritis    knees bilaterally  . Asthma    coincides with respiratory illness  . Ectopic pregnancy without intrauterine pregnancy 2013  . GERD (gastroesophageal reflux disease)   . Headache(784.0)   . Kidney stone 2009  . SVD (spontaneous vaginal delivery) 02/25/2014   Past Surgical History:  Procedure Laterality Date  .  DILATION AND CURETTAGE OF UTERUS  11/18/2012   Procedure: DILATATION AND CURETTAGE;  Surgeon: Serita KyleSheronette A Cousins, MD;  Location: [facility];  Service: Gynecology;  Laterality: N/A;  . falopian tube removed  December 2013   had a tubal pregnancy  . LAPAROSCOPY  11/18/2012   Procedure: LAPAROSCOPY OPERATIVE;  Surgeon: Serita KyleSheronette A Cousins, MD;  Location: [facility];  Service: Gynecology;  Laterality: N/A;  Operative Laparoscopy with removal of left fallopian tube and ectopic pregnancy, Dilation and evacuation   No Known Allergies Prior to Admission medications   Medication Sig Start Date End Date Taking? Authorizing Provider  albuterol (PROVENTIL HFA;VENTOLIN HFA) 108 (90 BASE) MCG/ACT inhaler Inhale 2 puffs into the lungs every 6 (six) hours as needed for wheezing. 05/05/13  Yes Elvina SidleKurt Lauenstein, MD  escitalopram (LEXAPRO) 5 MG tablet Take 5 mg by mouth daily.   Yes Historical Provider, MD  fexofenadine (ALLEGRA) 180 MG tablet Take 180 mg by mouth at bedtime.   Yes Historical Provider, MD   Social History   Social History  . Marital status: Married    Spouse name: N/A  . Number of children: N/A  . Years of education: N/A   Occupational History  . Not on file.   Social History Main Topics  . Smoking status: Former Smoker    Types: Cigarettes  . Smokeless tobacco: Never Used     Comment: 2014  . Alcohol use No  . Drug use: No  . Sexual activity: Yes   Other Topics  Concern  . Not on file   Social History Narrative  . No narrative on file   Review of Systems  Constitutional: Negative for chills, fatigue and fever.  HENT: Positive for congestion and sinus pressure.   Respiratory: Positive for cough. Negative for shortness of breath and wheezing.   Musculoskeletal: Positive for arthralgias and myalgias. Negative for gait problem and joint swelling.  Allergic/Immunologic: Positive for environmental allergies.       Objective:   Physical Exam  Constitutional: She is oriented to  person, place, and time. She appears well-developed and well-nourished. No distress.  HENT:  Head: Normocephalic and atraumatic.  Right Ear: Hearing, tympanic membrane, external ear and ear canal normal.  Left Ear: Hearing, tympanic membrane, external ear and ear canal normal.  Nose: Nose normal. Right sinus exhibits no maxillary sinus tenderness and no frontal sinus tenderness. Left sinus exhibits no maxillary sinus tenderness and no frontal sinus tenderness.  Mouth/Throat: Oropharynx is clear and moist. No oropharyngeal exudate.  Edema in turbinates bilaterally without discharge  Eyes: Conjunctivae and EOM are normal. Pupils are equal, round, and reactive to light.  Cardiovascular: Normal rate, regular rhythm, normal heart sounds and intact distal pulses.   No murmur heard. Pulmonary/Chest: Effort normal and breath sounds normal. No respiratory distress. She has no wheezes. She has no rhonchi.  Musculoskeletal:  Right ankle: malleoli non tender, 5th metatarsal and navicular non tender, full ROM of ankle, minimal tenderness along tibialis anterior, negative drawer, negative talar tilt  Neurological: She is alert and oriented to person, place, and time.  Skin: Skin is warm and dry. No rash noted.  Psychiatric: She has a normal mood and affect. Her behavior is normal.  Vitals reviewed.   Vitals:   08/19/16 0859  BP: 122/72  Pulse: 86  Resp: 17  Temp: 98.5 F (36.9 C)  TempSrc: Oral  SpO2: 100%  Weight: 183 lb (83 kg)  Height: 5\' 3"  (1.6 m)      Assessment & Plan:   Diane [name] is a 30 y.o. female Right ankle pain - Plan: Apply ASO ankle  - Dorsal ankle to dorsal foot pain, no bony tenderness. No known injury, x-ray deferred. Possible tibialis tendinitis, or overuse with more standing and walking at school. Initially tried lace up ankle splint but did not provide enough support for sore area, she preferred Cam Walker. We'll recheck in the next week to 10 days, but out of  splint/Cam Walker once per day at minimum. relative rest as able, RTC precautions if worse  Sinus congestion, PND (post-nasal drip)  - Probable viral illness with persistent sinus congestion versus allergies. Also overusing Afrin as now 2 weeks on that medication. Differential includes rhinitis medicamentosa.  -stop Afrin, start Flonase nasal spray and saline nasal spray.  -Early sinusitis possible, but printed prescription for amoxicillin if she is not improving into next week, Diflucan if needed for yeast infection.  - rtc precautions.   Meds ordered this encounter  Medications  . escitalopram (LEXAPRO) 5 MG tablet    Sig: Take 5 mg by mouth daily.  . fluticasone (FLONASE) 50 MCG/ACT nasal spray    Sig: Place 2 sprays into both nostrils daily.    Dispense:  16 g    Refill:  6  . amoxicillin (AMOXIL) 875 MG tablet    Sig: Take 1 tablet (875 mg total) by mouth 2 (two) times daily.    Dispense:  20 tablet    Refill:  0  . fluconazole (  DIFLUCAN) 150 MG tablet    Sig: Take 1 tablet (150 mg total) by mouth once.    Dispense:  1 tablet    Refill:  0   Patient Instructions   Your sinus symptoms may be due to congestion and even possible allergies. Stop the Afrin nasal spray as that may cause rebound congestion. Start Flonase nasal spray 2 sprays per nostril each day, and saline or salt water nasal spray 3 or 4 times a day. If your sinus congestion and pressure is not improving into next week, and especially if discolored nasal discharge at that time, can start amoxicillin. Diflucan if needed for signs or symptoms of yeast infection.  Wear the ankle brace for the next 1 week, out of the brace at least once or twice a day for range of motion. If you are having increased pain or pain with putting weight on your ankle, return for recheck and x-rays. Otherwise follow-up with me in the next 10 days if not significantly improved.    IF you received an x-ray today, you will receive an invoice from  [facility]. Please contact [facility] at [phone] with questions or concerns regarding your invoice.   IF you received labwork today, you will receive an invoice from [facility]. Please contact [facility] at [phone] with questions or concerns regarding your invoice.   Our billing staff will not be able to assist you with questions regarding bills from these companies.  You will be contacted with the lab results as soon as they are available. The fastest way to get your results is to activate your My Chart account. Instructions are located on the last page of this paperwork. If you have not heard from [place] regarding the results in 2 weeks, please contact this office.        I personally performed the services described in this documentation, which was scribed in my presence. The recorded information has been reviewed and considered, and addended by me as needed.   Signed,   Meredith Staggers, MD [facility].  08/19/16 11:16 AM

## 2016-09-16 ENCOUNTER — Ambulatory Visit (INDEPENDENT_AMBULATORY_CARE_PROVIDER_SITE_OTHER): Payer: BC Managed Care – PPO

## 2016-09-16 ENCOUNTER — Encounter: Payer: Self-pay | Admitting: Family Medicine

## 2016-09-16 ENCOUNTER — Ambulatory Visit (INDEPENDENT_AMBULATORY_CARE_PROVIDER_SITE_OTHER): Payer: BC Managed Care – PPO | Admitting: Family Medicine

## 2016-09-16 VITALS — BP 116/70 | HR 96 | Temp 98.5°F | Resp 18 | Ht 63.0 in

## 2016-09-16 DIAGNOSIS — M79671 Pain in right foot: Secondary | ICD-10-CM | POA: Diagnosis not present

## 2016-09-16 DIAGNOSIS — M25571 Pain in right ankle and joints of right foot: Secondary | ICD-10-CM

## 2016-09-16 DIAGNOSIS — Z23 Encounter for immunization: Secondary | ICD-10-CM | POA: Diagnosis not present

## 2016-09-16 DIAGNOSIS — M76821 Posterior tibial tendinitis, right leg: Secondary | ICD-10-CM

## 2016-09-16 NOTE — Patient Instructions (Addendum)
  Although the pain appears to be due to one of the muscle tendons in the foot, a nerve entrapment is also possible. I will refer you to an orthopedist for further evaluation and possible physical therapy. For now continue the walking boot, ibuprofen up to every 6 hours with food, and if applying ice, do not exceed 15 minutes.    IF you received an x-ray today, you will receive an invoice from [facility]. Please contact [facility] at [phone] with questions or concerns regarding your invoice.   IF you received labwork today, you will receive an invoice from [facility]. Please contact [facility] at [phone] with questions or concerns regarding your invoice.   Our billing staff will not be able to assist you with questions regarding bills from these companies.  You will be contacted with the lab results as soon as they are available. The fastest way to get your results is to activate your My Chart account. Instructions are located on the last page of this paperwork. If you have not heard from us regarding the results in 2 weeks, please contact this office.

## 2016-09-16 NOTE — Progress Notes (Addendum)
Subjective:  By signing my name below, I, Diane [name], attest that this documentation has been prepared under the direction and in the presence of Diane Staggers, MD. Electronically Signed: Stann [name], Scribe. 09/16/2016 , 8:35 AM .  Patient was seen in Room 4 .   Patient ID: Diane [name], female    DOB: Dec 22, 1985, 30 y.o.   MRN: [id number] Chief Complaint  Patient presents with  . Follow-up    right foot   HPI Diane [name] is a 30 y.o. female Here for follow up of right ankle pain, thought to be tibialis tendonitis or possible overuse with returning to school. Attempted lace up ankle brace, so changed to camwalker. Recommended follow up in 1 week to 10 days.   Patient states she wore the camwalker boot for 2 weeks and the pain improved but not 100%. She stopped wearing it and wore lace up ankle brace. After 3 days of wearing the ankle brace, her pain returned. She followed up with an Event organiser and nurse at her school, and was recommended having xray done. She's been taking ibuprofen 600mg  bid. She's also been applying ice once a day for 15 minutes.   She's a Therapist, occupational.   Patient Active Problem List   Diagnosis Date Noted  . SVD (spontaneous vaginal delivery) 02/25/2014  . Postpartum care following vaginal delivery (02/24/14) 02/24/2014  . Ectopic pregnancy 11/18/2012   Past Medical History:  Diagnosis Date  . Allergy   . Arthritis    knees bilaterally  . Asthma    coincides with respiratory illness  . Ectopic pregnancy without intrauterine pregnancy 2013  . GERD (gastroesophageal reflux disease)   . Headache(784.0)   . Kidney stone 2009  . SVD (spontaneous vaginal delivery) 02/25/2014   Past Surgical History:  Procedure Laterality Date  . DILATION AND CURETTAGE OF UTERUS  11/18/2012   Procedure: DILATATION AND CURETTAGE;  Surgeon: Diane Kyle, MD;  Location: [facility];  Service: Gynecology;  Laterality: N/A;  . falopian tube  removed  December 2013   had a tubal pregnancy  . LAPAROSCOPY  11/18/2012   Procedure: LAPAROSCOPY OPERATIVE;  Surgeon: Diane Kyle, MD;  Location: [facility];  Service: Gynecology;  Laterality: N/A;  Operative Laparoscopy with removal of left fallopian tube and ectopic pregnancy, Dilation and evacuation   No Known Allergies Prior to Admission medications   Medication Sig Start Date End Date Taking? Authorizing Provider  albuterol (PROVENTIL HFA;VENTOLIN HFA) 108 (90 BASE) MCG/ACT inhaler Inhale 2 puffs into the lungs every 6 (six) hours as needed for wheezing. 05/05/13   Diane Sidle, MD  amoxicillin (AMOXIL) 875 MG tablet Take 1 tablet (875 mg total) by mouth 2 (two) times daily. 08/19/16   Diane Flood, MD  escitalopram (LEXAPRO) 5 MG tablet Take 5 mg by mouth daily.    Historical Provider, MD  fexofenadine (ALLEGRA) 180 MG tablet Take 180 mg by mouth at bedtime.    Historical Provider, MD  fluticasone (FLONASE) 50 MCG/ACT nasal spray Place 2 sprays into both nostrils daily. 08/19/16   Diane Flood, MD   Social History   Social History  . Marital status: Married    Spouse name: N/A  . Number of children: N/A  . Years of education: N/A   Occupational History  . Not on file.   Social History Main Topics  . Smoking status: Former Smoker    Types: Cigarettes  . Smokeless tobacco: Never Used  Comment: 2014  . Alcohol use No  . Drug use: No  . Sexual activity: Yes   Other Topics Concern  . Not on file   Social History Narrative  . No narrative on file   Review of Systems  Constitutional: Negative for chills, fatigue and fever.  Musculoskeletal: Positive for arthralgias. Negative for gait problem and joint swelling.  Skin: Negative for color change, rash and wound.  Neurological: Negative for dizziness, weakness, light-headedness, numbness and headaches.       Objective:   Physical Exam  Constitutional: She is oriented to person, place, and time. She appears  well-developed and well-nourished. No distress.  HENT:  Head: Normocephalic and atraumatic.  Eyes: EOM are normal. Pupils are equal, round, and reactive to light.  Neck: Neck supple.  Cardiovascular: Normal rate.   Pulmonary/Chest: Effort normal. No respiratory distress.  Musculoskeletal: Normal range of motion.  Right ankle: no bony tenderness, pain over tibialis anterior tendon, full ROM, full strength but pain with dorsiflexion of the foot, navicula and 5th metatarsal are non tender, skin intact, no soft tissue swelling seen, no warm or erythema  Neurological: She is alert and oriented to person, place, and time.  Skin: Skin is warm and dry.  Psychiatric: She has a normal mood and affect. Her behavior is normal.  Nursing note and vitals reviewed.   Vitals:   09/16/16 0811  BP: 116/70  Pulse: 96  Resp: 18  Temp: 98.5 F (36.9 C)  TempSrc: Oral  SpO2: 100%  Height: 5\' 3"  (1.6 m)  Dg Ankle Complete Right  Result Date: 09/16/2016 CLINICAL DATA:  Right ankle pain.  No known injury. EXAM: RIGHT ANKLE - COMPLETE 3+ VIEW COMPARISON:  None. FINDINGS: There is no evidence of fracture, dislocation, or joint effusion. There is no evidence of arthropathy or other focal bone abnormality. Soft tissues are unremarkable. IMPRESSION: Normal exam. Electronically Signed   By: Diane Kannerhomas  Dalessio M.D.   On: 09/16/2016 09:05       Assessment & Plan:   Diane [name] is a 30 y.o. female Need for prophylactic vaccination and inoculation against influenza - Plan: Flu Vaccine QUAD 36+ mos IM  Acute right ankle pain - Plan: DG Ankle Complete Right  Tibialis tendinitis of right lower extremity - Plan: DG Ankle Complete Right  Based on location and lack of injury, suspected tendinitis of dorsal foot/ankle  Tendon, possibly tib anterior tendontis, but also some midfoot pain, so DDX includes deep peroneal nn entrapment/pain.  Denies any recent tight shoes, or known causes. Did improve with Cam Walker,  but worsened again when using lace up ankle brace.   -[facility], refer to orthopedics for eval. continue ibuprofen, relative rest, ice only if needed.  No orders of the defined types were placed in this encounter.  Patient Instructions    Although the pain appears to be due to one of the muscle tendons, a nerve entrapment is also possible. I will refer you to an orthopedist for further evaluation and possible physical therapy. For now continue the walking boot, ibuprofen up to every 6 hours with food, and if applying ice, do not exceed 15 minutes.    IF you received an x-ray today, you will receive an invoice from [facility]. Please contact [facility] at [phone] with questions or concerns regarding your invoice.   IF you received labwork today, you will receive an invoice from [facility]. Please contact [facility] at [phone] with questions or  concerns regarding your invoice.   Our billing staff will not be able to assist you with questions regarding bills from these companies.  You will be contacted with the lab results as soon as they are available. The fastest way to get your results is to activate your My Chart account. Instructions are located on the last page of this paperwork. If you have not heard from [place] regarding the results in 2 weeks, please contact this office.       I personally performed the services described in this documentation, which was scribed in my presence. The recorded information has been reviewed and considered, and addended by me as needed.   Signed,   Diane Staggers, MD [facility].  09/16/16 8:42 AM

## 2017-02-16 ENCOUNTER — Ambulatory Visit: Payer: BC Managed Care – PPO

## 2017-02-25 ENCOUNTER — Ambulatory Visit (INDEPENDENT_AMBULATORY_CARE_PROVIDER_SITE_OTHER): Payer: BC Managed Care – PPO | Admitting: Family Medicine

## 2017-02-25 VITALS — BP 118/82 | HR 92 | Temp 97.8°F | Resp 17 | Ht 62.5 in | Wt 195.6 lb

## 2017-02-25 DIAGNOSIS — R059 Cough, unspecified: Secondary | ICD-10-CM

## 2017-02-25 DIAGNOSIS — R05 Cough: Secondary | ICD-10-CM

## 2017-02-25 DIAGNOSIS — J0100 Acute maxillary sinusitis, unspecified: Secondary | ICD-10-CM

## 2017-02-25 MED ORDER — IPRATROPIUM BROMIDE 0.03 % NA SOLN: 2.0000 | Freq: Two times a day (BID) | NASAL | 0 refills | Status: DC

## 2017-02-25 MED ORDER — AMOXICILLIN-POT CLAVULANATE 875-125 MG PO TABS: 1.0000 | ORAL_TABLET | Freq: Two times a day (BID) | ORAL | 0 refills | Status: DC

## 2017-02-25 MED ORDER — HYDROCOD POLST-CPM POLST ER 10-8 MG/5ML PO SUER: 5.0000 mL | Freq: Two times a day (BID) | ORAL | 0 refills | Status: DC | PRN

## 2017-02-25 MED ORDER — FLUCONAZOLE 150 MG PO TABS: 150.0000 mg | ORAL_TABLET | Freq: Once | ORAL | 0 refills | Status: AC

## 2017-02-25 NOTE — Patient Instructions (Addendum)
-Start Augmentin 1 tablet twice daily with food to avoid stomach upset x 10 days Complete all medication.  -Atrovent, 2 sprays per nares twice daily for nasal congestion.  -If vaginal irritation develops, take Diflucan 150 mg and may repeat if symptoms persists in 5 days.   IF you received an x-ray today, you will receive an invoice from [facility]. Please contact [facility] at [phone] with questions or concerns regarding your invoice.   IF you received labwork today, you will receive an invoice from [facility]. Please contact [facility] at [phone] with questions or concerns regarding your invoice.   Our billing staff will not be able to assist you with questions regarding bills from these companies.  You will be contacted with the lab results as soon as they are available. The fastest way to get your results is to activate your My Chart account. Instructions are located on the last page of this paperwork. If you have not heard from us regarding the results in 2 weeks, please contact this office.      Sinusitis, Adult Sinusitis is soreness and inflammation of your sinuses. Sinuses are hollow spaces in the bones around your face. They are located:  Around your eyes.  In the middle of your forehead.  Behind your nose.  In your cheekbones. Your sinuses and nasal passages are lined with a stringy fluid (mucus). Mucus normally drains out of your sinuses. When your nasal tissues get inflamed or swollen, the mucus can get trapped or blocked so air cannot flow through your sinuses. This lets bacteria, viruses, and funguses grow, and that leads to infection. Follow these instructions at home: Medicines   Take, use, or apply over-the-counter and prescription medicines only as told by your doctor. These may include nasal sprays.  If you were prescribed an antibiotic medicine, take it as told by your doctor. Do not stop taking the antibiotic even if you start to feel  better. Hydrate and Humidify   Drink enough water to keep your pee (urine) clear or pale yellow.  Use a cool mist humidifier to keep the humidity level in your home above 50%.  Breathe in steam for 10-15 minutes, 3-4 times a day or as told by your doctor. You can do this in the bathroom while a hot shower is running.  Try not to spend time in cool or dry air. Rest   Rest as much as possible.  Sleep with your head raised (elevated).  Make sure to get enough sleep each night. General instructions   Put a warm, moist washcloth on your face 3-4 times a day or as told by your doctor. This will help with discomfort.  Wash your hands often with soap and water. If there is no soap and water, use hand sanitizer.  Do not smoke. Avoid being around people who are smoking (secondhand smoke).  Keep all follow-up visits as told by your doctor. This is important. Contact a doctor if:  You have a fever.  Your symptoms get worse.  Your symptoms do not get better within 10 days. Get help right away if:  You have a very bad headache.  You cannot stop throwing up (vomiting).  You have pain or swelling around your face or eyes.  You have trouble seeing.  You feel confused.  Your neck is stiff.  You have trouble breathing. This information is not intended to replace advice given to you by your health care provider. Make sure you discuss any questions you have  with your health care provider. Document Released: 05/16/2008 Document Revised: 07/24/2016 Document Reviewed: 09/23/2015 Elsevier Interactive Patient Education  2017 [employer].

## 2017-02-25 NOTE — Progress Notes (Signed)
    SUBJECTIVE:  Diane [name] is a 31 y.o. female who complains of persistent nasal congestion X 2 weeks. Two days of sore throat and intermittent productive cough. She has taken sinus rinse and tylenol sinus medication initially with improved symptoms which have not worsened. Reports an intermittent headache, significant pressure within the maxillary sinus region.Bilateral ear pressure.  Reports a history  of asthma although denies symptoms during this current illness. Patient is a former smoker.  Past Medical History:  Diagnosis Date  . Allergy   . Arthritis    knees bilaterally  . Asthma    coincides with respiratory illness  . Ectopic pregnancy without intrauterine pregnancy 2013  . GERD (gastroesophageal reflux disease)   . Headache(784.0)   . Kidney stone 2009  . SVD (spontaneous vaginal delivery) 02/25/2014   Social History   Social History  . Marital status: Married    Spouse name: N/A  . Number of children: N/A  . Years of education: N/A   Occupational History  . Not on file.   Social History Main Topics  . Smoking status: Former Smoker    Types: Cigarettes  . Smokeless tobacco: Never Used     Comment: 2014  . Alcohol use No  . Drug use: No  . Sexual activity: Yes   Other Topics Concern  . Not on file   Social History Narrative  . No narrative on file    OBJECTIVE: She appears well, vital signs are as noted. Ears normal.  Throat and pharynx edematous and erythematous. Neck supple. No adenopathy in the neck. Nose is congested. Sinuses tenderness in maxillary region. non tender.  The chest is clear, without wheezes or rales. Heart rate and rhythm regular.  ASSESSMENT:  Sinusitis   PLAN: -Start Augmentin 1 tablet twice daily with food to avoid stomach upset x 10 days Complete all medication. -Atrovent, 2 sprays per nares twice daily. -If vaginal irritation develops, take Diflucan 150 mg and may repeat if symptoms persists in 5 days. -Call or return  to clinic prn if these symptoms worsen or fail to improve as anticipated.  Godfrey PickKimberly S. Tiburcio PeaHarris, MSN, FNP-C [facility] [phone]

## 2017-03-21 ENCOUNTER — Other Ambulatory Visit: Payer: Self-pay | Admitting: Family Medicine

## 2018-09-06 ENCOUNTER — Encounter (INDEPENDENT_AMBULATORY_CARE_PROVIDER_SITE_OTHER): Payer: BC Managed Care – PPO

## 2018-09-10 ENCOUNTER — Encounter (INDEPENDENT_AMBULATORY_CARE_PROVIDER_SITE_OTHER): Payer: Self-pay | Admitting: Family Medicine

## 2018-09-10 ENCOUNTER — Ambulatory Visit (INDEPENDENT_AMBULATORY_CARE_PROVIDER_SITE_OTHER): Payer: BC Managed Care – PPO | Admitting: Family Medicine

## 2018-09-10 VITALS — BP 131/81 | HR 89 | Temp 97.9°F | Ht 63.0 in | Wt 209.0 lb

## 2018-09-10 DIAGNOSIS — R5383 Other fatigue: Secondary | ICD-10-CM

## 2018-09-10 DIAGNOSIS — Z1331 Encounter for screening for depression: Secondary | ICD-10-CM

## 2018-09-10 DIAGNOSIS — E7849 Other hyperlipidemia: Secondary | ICD-10-CM

## 2018-09-10 DIAGNOSIS — R0602 Shortness of breath: Secondary | ICD-10-CM | POA: Diagnosis not present

## 2018-09-10 DIAGNOSIS — R739 Hyperglycemia, unspecified: Secondary | ICD-10-CM

## 2018-09-10 DIAGNOSIS — Z9189 Other specified personal risk factors, not elsewhere classified: Secondary | ICD-10-CM | POA: Diagnosis not present

## 2018-09-10 DIAGNOSIS — Z6837 Body mass index (BMI) 37.0-37.9, adult: Secondary | ICD-10-CM

## 2018-09-10 DIAGNOSIS — Z0289 Encounter for other administrative examinations: Secondary | ICD-10-CM

## 2018-09-10 NOTE — Progress Notes (Signed)
Office: [phone]  /  Fax: [phone]   Dear Dr. Cherly Hensen,   Thank you for referring Diane [name] to our clinic. The following note includes my evaluation and treatment recommendations.  HPI:   Chief Complaint: OBESITY    Diane [name] has been referred by Sheronette A. Cherly Hensen, MD for consultation regarding her obesity and obesity related comorbidities.    Diane [name] (MR# [id number]) is a 32 y.o. female who presents on 09/10/2018 for obesity evaluation and treatment. Current BMI is Body mass index is 37.02 kg/m.Marland Kitchen Diane [name] has been struggling with her weight for many years and has been unsuccessful in either losing weight, maintaining weight loss, or reaching her healthy weight goal.     Diane [name] attended our information session and states she is currently in the action stage of change and ready to dedicate time achieving and maintaining a healthier weight. Diane [name] is interested in becoming our patient and working on intensive lifestyle modifications including (but not limited to) diet, exercise and weight loss.    Diane [name] states her family eats meals together she thinks her family will eat healthier with  her she struggles with family and or coworkers weight loss sabotage her desired weight loss is 34 lbs she has been heavy most of  her life she started gaining weight after 3rd pregnancy her heaviest weight ever was 220 lbs she has significant food cravings issues  she snacks frequently in the evenings she skips meals frequently she is frequently drinking liquids with calories she frequently makes poor food choices she struggles with emotional eating    Diane [name] feels her energy is lower than it should be. This has worsened with weight gain and has not worsened recently. Diane [name] admits to daytime somnolence and  admits to waking up still tired. Patient is at risk for obstructive sleep apnea. Patent has a history of symptoms of daytime Diane and  morning headache. Patient generally gets 7 hours of sleep per night, and states they generally have nightime awakenings. Snoring is present. Apneic episodes are not present. Epworth Sleepiness Score is 12.  Dyspnea on exertion Diane [name] notes increasing shortness of breath with exercising and seems to be worsening over time with weight gain. She notes getting out of breath sooner with activity than she used to. This has not gotten worse recently. Diane [name] denies orthopnea.  Hyperlipidemia Chetara has hyperlipidemia and has been trying to improve her cholesterol levels with intensive lifestyle modification including a low saturated fat diet, exercise and weight loss. She has no recent labs and she is not on statin. She denies any chest pain, claudication or myalgias.  At risk for cardiovascular disease Kaylia is at a higher than average risk for cardiovascular disease due to obesity and hyperlipidemia. She currently denies any chest pain.  Hyperglycemia Diane [name] has a history of some slightly elevated blood glucose readings in the past, without a diagnosis of diabetes. She admits to polyphagia.  Depression Screen Diane [name]'s Food and Mood (modified PHQ-9) score was  Depression screen PHQ 2/9 09/10/2018  Decreased Interest 2  Down, Depressed, Hopeless 1  PHQ - 2 Score 3  Altered sleeping 3  Tired, decreased energy 2  Change in appetite 3  Feeling bad or failure about yourself  3  Trouble concentrating 0  Moving slowly or fidgety/restless 0  Suicidal thoughts 1  PHQ-9 Score 15  Difficult doing work/chores Not difficult at all    ALLERGIES: No Known Allergies  MEDICATIONS: Current Outpatient Medications on File Prior to Visit  Medication Sig Dispense Refill  . buPROPion (WELLBUTRIN SR) 100 MG 12 hr tablet Take 100 mg by mouth 2 (two) times daily.    . Chlorphen-PE-Acetaminophen (NOREL AD) 4-10-325 MG TABS Take 1 tablet by mouth. Take one tablet by mouth every 4-6 hours as  needed    . divalproex (DEPAKOTE) 250 MG DR tablet Take 250 mg by mouth 3 (three) times daily as needed.    Marland Kitchen escitalopram (LEXAPRO) 20 MG tablet Take 20 mg by mouth daily.     Marland Kitchen estradiol (VIVELLE-DOT) 0.1 MG/24HR patch Place 1 patch onto the skin 2 (two) times a week. Apply one patch 2 x a week during menstration    . fluticasone (FLONASE) 50 MCG/ACT nasal spray Place 2 sprays into both nostrils daily. 16 g 6  . ibuprofen (ADVIL,MOTRIN) 600 MG tablet Take 600 mg by mouth every 6 (six) hours as needed.    . norgestimate-ethinyl estradiol (ORTHO-CYCLEN,SPRINTEC,PREVIFEM) 0.25-35 MG-MCG tablet Take 1 tablet by mouth daily.    . SUMAtriptan (IMITREX) 25 MG tablet Take 25 mg by mouth every 2 (two) hours as needed for migraine. May repeat in 2 hours if headache persists or recurs.     No current facility-administered medications on file prior to visit.     PAST MEDICAL HISTORY: Past Medical History:  Diagnosis Date  . Allergy    seasonal allergies  . Anxiety   . Arthritis    knees bilaterally  . Asthma    coincides with respiratory illness  . Back pain   . Constipation   . Depression   . Ectopic pregnancy without intrauterine pregnancy 2013  . GERD (gastroesophageal reflux disease)   . Headache(784.0)    migraines  . Joint pain   . Kidney stone 2009  . Lactose intolerance   . Leg edema   . Rheumatoid arthritis (HCC)   . SVD (spontaneous vaginal delivery) 02/25/2014  . Vitamin D deficiency     PAST SURGICAL HISTORY: Past Surgical History:  Procedure Laterality Date  . DILATION AND CURETTAGE OF UTERUS  11/18/2012   Procedure: DILATATION AND CURETTAGE;  Surgeon: Serita Kyle, MD;  Location: [facility];  Service: Gynecology;  Laterality: N/A;  . falopian tube removed  December 2013   had a tubal pregnancy  . LAPAROSCOPY  11/18/2012   Procedure: LAPAROSCOPY OPERATIVE;  Surgeon: Serita Kyle, MD;  Location: [facility];  Service: Gynecology;  Laterality: N/A;  Operative  Laparoscopy with removal of left fallopian tube and ectopic pregnancy, Dilation and evacuation    SOCIAL HISTORY: Social History   Tobacco Use  . Smoking status: Former Smoker    Types: Cigarettes  . Smokeless tobacco: Never Used  . Tobacco comment: 2014  Substance Use Topics  . Alcohol use: No  . Drug use: No    FAMILY HISTORY: Family History  Problem Relation Age of Onset  . Asthma Mother   . Obesity Mother   . Hypertension Mother   . Diabetes Father   . Hypertension Father   . Asthma Brother   . Asthma Daughter   . Cancer Maternal Grandmother   . Diabetes Paternal Grandmother   . Hypertension Paternal Grandmother   . Diabetes Paternal Grandfather   . Hypertension Paternal Grandfather   . Hearing loss Paternal Grandfather        work related  . Other Neg Hx     ROS: Review of Systems  Constitutional: Positive for malaise/Diane. Negative for weight loss.       +  Trouble sleeping  Eyes:       + Vision changes + Wear glasses or contacts  Respiratory: Positive for shortness of breath (with exertion).   Cardiovascular: Negative for chest pain and claudication.       + Calf/leg pain with walking + Leg cramping  Gastrointestinal: Positive for heartburn.  Musculoskeletal: Positive for back pain. Negative for myalgias.       +Muscle or joint pain  Skin:       + Dryness  Neurological: Positive for weakness and headaches.  Endo/Heme/Allergies:       Positive polyphagia  Psychiatric/Behavioral: Positive for depression. Negative for suicidal ideas. The patient is nervous/anxious.        + Stress    PHYSICAL EXAM: Blood pressure 131/81, pulse 89, temperature 97.9 F (36.6 C), temperature source Oral, height 5\' 3"  (1.6 m), weight 209 lb (94.8 kg), last menstrual period 08/25/2018, SpO2 100 %, unknown if currently breastfeeding. Body mass index is 37.02 kg/m. Physical Exam  Constitutional: She is oriented to person, place, and time. She appears well-developed and  well-nourished.  HENT:  Head: Normocephalic and atraumatic.  Nose: Nose normal.  Eyes: EOM are normal. No scleral icterus.  Neck: Normal range of motion. Neck supple. No thyromegaly present.  Cardiovascular: Normal rate and regular rhythm.  Pulmonary/Chest: Effort normal. No respiratory distress.  Abdominal: Soft. There is no tenderness.  + Obesity  Musculoskeletal:  Range of Motion normal in all 4 extremities Trace edema noted in bilateral lower extremities  Neurological: She is alert and oriented to person, place, and time. Coordination normal.  Skin: Skin is warm and dry.  Psychiatric: She has a normal mood and affect. Her behavior is normal.  Vitals reviewed.   RECENT LABS AND TESTS: BMET    Component Value Date/Time   NA 137 11/18/2012 0610   K 3.9 11/18/2012 0610   CL 103 11/18/2012 0610   CO2 22 11/18/2012 0610   GLUCOSE 93 11/18/2012 0610   BUN 7 11/18/2012 0610   CREATININE 0.74 11/18/2012 0610   CALCIUM 9.6 11/18/2012 0610   GFRNONAA >90 11/18/2012 0610   GFRAA >90 11/18/2012 0610   No results found for: HGBA1C No results found for: INSULIN CBC    Component Value Date/Time   WBC 19.0 (H) 02/25/2014 0611   RBC 3.38 (L) 02/25/2014 0611   HGB 9.9 (L) 02/25/2014 0611   HCT 28.7 (L) 02/25/2014 0611   PLT 155 02/25/2014 0611   MCV 84.9 02/25/2014 0611   MCH 29.3 02/25/2014 0611   MCHC 34.5 02/25/2014 0611   RDW 13.7 02/25/2014 0611   LYMPHSABS 1.2 11/18/2012 0610   MONOABS 1.1 (H) 11/18/2012 0610   EOSABS 0.0 11/18/2012 0610   BASOSABS 0.0 11/18/2012 0610   Iron/TIBC/Ferritin/ %Sat No results found for: IRON, TIBC, FERRITIN, IRONPCTSAT Lipid Panel  No results found for: CHOL, TRIG, HDL, CHOLHDL, VLDL, LDLCALC, LDLDIRECT Hepatic Function Panel     Component Value Date/Time   PROT 7.3 11/18/2012 0610   ALBUMIN 4.0 11/18/2012 0610   AST 13 11/18/2012 0610   ALT 9 11/18/2012 0610   ALKPHOS 49 11/18/2012 0610   BILITOT 0.4 11/18/2012 0610   No results  found for: TSH  ECG  shows NSR with a rate of 91 BPM INDIRECT CALORIMETER done today shows a VO2 of 230 and a REE of 1602.  Her calculated basal metabolic rate is 1610 thus her basal metabolic rate is worse than expected.    ASSESSMENT AND PLAN: Other Diane -  Plan: EKG 12-Lead, Vitamin B12, CBC With Differential, Comprehensive metabolic panel, T3, T4, free, TSH, VITAMIN D 25 Hydroxy (Vit-D Deficiency, Fractures), Folate  Shortness of breath on exertion  Other hyperlipidemia - Plan: Lipid Panel With LDL/HDL Ratio  Hyperglycemia - Plan: Hemoglobin A1c, Insulin, random  Depression screening  At risk for heart disease  Class 2 severe obesity with serious comorbidity and body mass index (BMI) of 37.0 to 37.9 in adult, unspecified obesity type (HCC)  PLAN:  Diane Diane [name] was informed that her Diane may be related to obesity, depression or many other causes. Labs will be ordered, and in the meanwhile Maygen has agreed to work on diet, exercise and weight loss to help with Diane. Proper sleep hygiene was discussed including the need for 7-8 hours of quality sleep each night. A sleep study was not ordered based on symptoms and Epworth score.  Dyspnea on exertion Diane [name]'s shortness of breath appears to be obesity related and exercise induced. She has agreed to work on weight loss and gradually increase exercise to treat her exercise induced shortness of breath. If Diane [name] follows our instructions and loses weight without improvement of her shortness of breath, we will plan to refer to pulmonology. We will monitor this condition regularly. Diane [name] agrees to this plan.  Hyperlipidemia Diane [name] was informed of the American Heart Association Guidelines emphasizing intensive lifestyle modifications as the first line treatment for hyperlipidemia. We discussed many lifestyle modifications today in depth, and Jaiyah will continue to work on decreasing saturated fats such as  fatty red meat, butter and many fried foods. She will start diet and will also increase vegetables and lean protein in her diet and continue to work on exercise and weight loss efforts. We will check labs and Diane [name] agrees to follow up with our clinic in 2 weeks.  Cardiovascular risk counselling Diane [name] was given extended (15 minutes) coronary artery disease prevention counseling today. She is 32 y.o. female and has risk factors for heart disease including obesity and hyperlipidemia. We discussed intensive lifestyle modifications today with an emphasis on specific weight loss instructions and strategies. Pt was also informed of the importance of increasing exercise and decreasing saturated fats to help prevent heart disease.  Hyperglycemia Fasting labs will be obtained today and results with be discussed with Diane [name] in 2 weeks at her follow up visit. In the meanwhile Diane [name] was started on a lower simple carbohydrate diet and will work on weight loss efforts.  Depression Screen Diane [name] had a strongly positive depression screening. Depression is commonly associated with obesity and often results in emotional eating behaviors. We will monitor this closely and work on CBT to help improve the non-hunger eating patterns. Referral to Psychology may be required if no improvement is seen as she continues in our clinic.  Obesity Shiquita is currently in the action stage of change and her goal is to continue with weight loss efforts. I recommend Loyce begin the structured treatment plan as follows:  She has agreed to follow the Category 2 plan + 100 calories Maureena has been instructed to eventually work up to a goal of 150 minutes of combined cardio and strengthening exercise per week for weight loss and overall health benefits. We discussed the following Behavioral Modification Strategies today: increasing lean protein intake, decreasing simple carbohydrates  and work on meal planning and  easy cooking plans   She was informed of the importance of frequent follow up visits to maximize her success with intensive lifestyle modifications for her multiple health  conditions. She was informed we would discuss her lab results at her next visit unless there is a critical issue that needs to be addressed sooner. Hitomi agreed to keep her next visit at the agreed upon time to discuss these results.    OBESITY BEHAVIORAL INTERVENTION VISIT  Today's visit was # 1   Starting weight: 209 lbs Starting date: 09/10/18 Today's weight : 209 lbs  Today's date: 09/10/2018 Total lbs lost to date: 0    ASK: We discussed the diagnosis of obesity with Brigitt D Strum today and Orlando agreed to give [place] permission to discuss obesity behavioral modification therapy today.  ASSESS: Annamarie has the diagnosis of obesity and her BMI today is 37.03 Jelesa is in the action stage of change   ADVISE: Maille was educated on the multiple health risks of obesity as well as the benefit of weight loss to improve her health. She was advised of the need for long term treatment and the importance of lifestyle modifications to improve her current health and to decrease her risk of future health problems.  AGREE: Multiple dietary modification options and treatment options were discussed and  Deaven agreed to follow the recommendations documented in the above note.  ARRANGE: Aila was educated on the importance of frequent visits to treat obesity as outlined per CMS and USPSTF guidelines and agreed to schedule her next follow up appointment today.  I, Burt Knack, am acting as transcriptionist for Quillian Quince, MD   I have reviewed the above documentation for accuracy and completeness, and I agree with the above. -Quillian Quince, MD

## 2018-09-11 LAB — CBC WITH DIFFERENTIAL
Basophils Absolute: 0 10*3/uL (ref 0.0–0.2)
Basos: 0 %
EOS (ABSOLUTE): 0.3 10*3/uL (ref 0.0–0.4)
EOS: 3 %
HEMATOCRIT: 38.3 % (ref 34.0–46.6)
Hemoglobin: 12.4 g/dL (ref 11.1–15.9)
IMMATURE GRANS (ABS): 0 10*3/uL (ref 0.0–0.1)
Immature Granulocytes: 0 %
Lymphocytes Absolute: 1.8 10*3/uL (ref 0.7–3.1)
Lymphs: 17 %
MCH: 27.3 pg (ref 26.6–33.0)
MCHC: 32.4 g/dL (ref 31.5–35.7)
MCV: 84 fL (ref 79–97)
Monocytes Absolute: 0.5 10*3/uL (ref 0.1–0.9)
Monocytes: 5 %
NEUTROS ABS: 7.8 10*3/uL — AB (ref 1.4–7.0)
Neutrophils: 75 %
RBC: 4.54 x10E6/uL (ref 3.77–5.28)
RDW: 14.3 % (ref 12.3–15.4)
WBC: 10.5 10*3/uL (ref 3.4–10.8)

## 2018-09-11 LAB — COMPREHENSIVE METABOLIC PANEL
A/G RATIO: 1.3 (ref 1.2–2.2)
ALT: 9 IU/L (ref 0–32)
AST: 13 IU/L (ref 0–40)
Albumin: 4 g/dL (ref 3.5–5.5)
Alkaline Phosphatase: 59 IU/L (ref 39–117)
BUN/Creatinine Ratio: 8 — ABNORMAL LOW (ref 9–23)
BUN: 9 mg/dL (ref 6–20)
CALCIUM: 9.5 mg/dL (ref 8.7–10.2)
CO2: 24 mmol/L (ref 20–29)
Chloride: 100 mmol/L (ref 96–106)
Creatinine, Ser: 1.11 mg/dL — ABNORMAL HIGH (ref 0.57–1.00)
GFR, EST AFRICAN AMERICAN: 76 mL/min/{1.73_m2} (ref >59)
GFR, EST NON AFRICAN AMERICAN: 66 mL/min/{1.73_m2} (ref >59)
Globulin, Total: 3.2 g/dL (ref 1.5–4.5)
Glucose: 82 mg/dL (ref 65–99)
POTASSIUM: 4.3 mmol/L (ref 3.5–5.2)
SODIUM: 141 mmol/L (ref 134–144)
TOTAL PROTEIN: 7.2 g/dL (ref 6.0–8.5)

## 2018-09-11 LAB — LIPID PANEL WITH LDL/HDL RATIO
Cholesterol, Total: 207 mg/dL — ABNORMAL HIGH (ref 100–199)
HDL: 75 mg/dL (ref >39)
LDL Calculated: 108 mg/dL — ABNORMAL HIGH (ref 0–99)
LDL/HDL RATIO: 1.4 ratio (ref 0.0–3.2)
Triglycerides: 121 mg/dL (ref 0–149)
VLDL CHOLESTEROL CAL: 24 mg/dL (ref 5–40)

## 2018-09-11 LAB — INSULIN, RANDOM: INSULIN: 23.3 u[IU]/mL (ref 2.6–24.9)

## 2018-09-11 LAB — T4, FREE: Free T4: 1.22 ng/dL (ref 0.82–1.77)

## 2018-09-11 LAB — HEMOGLOBIN A1C
Est. average glucose Bld gHb Est-mCnc: 114 mg/dL
Hgb A1c MFr Bld: 5.6 % (ref 4.8–5.6)

## 2018-09-11 LAB — T3: T3 TOTAL: 187 ng/dL — AB (ref 71–180)

## 2018-09-11 LAB — VITAMIN B12: Vitamin B-12: 645 pg/mL (ref 232–1245)

## 2018-09-11 LAB — FOLATE: FOLATE: 7.3 ng/mL (ref >3.0)

## 2018-09-11 LAB — TSH: TSH: 5.11 u[IU]/mL — ABNORMAL HIGH (ref 0.450–4.500)

## 2018-09-11 LAB — VITAMIN D 25 HYDROXY (VIT D DEFICIENCY, FRACTURES): Vit D, 25-Hydroxy: 24.6 ng/mL — ABNORMAL LOW (ref 30.0–100.0)

## 2018-09-17 ENCOUNTER — Encounter (INDEPENDENT_AMBULATORY_CARE_PROVIDER_SITE_OTHER): Payer: Self-pay | Admitting: Family Medicine

## 2018-09-24 ENCOUNTER — Ambulatory Visit (INDEPENDENT_AMBULATORY_CARE_PROVIDER_SITE_OTHER): Payer: BC Managed Care – PPO | Admitting: Family Medicine

## 2018-09-24 VITALS — BP 121/81 | HR 84 | Temp 98.1°F | Ht 63.0 in | Wt 205.0 lb

## 2018-09-24 DIAGNOSIS — E66812 Obesity, class 2: Secondary | ICD-10-CM

## 2018-09-24 DIAGNOSIS — Z9189 Other specified personal risk factors, not elsewhere classified: Secondary | ICD-10-CM | POA: Diagnosis not present

## 2018-09-24 DIAGNOSIS — E559 Vitamin D deficiency, unspecified: Secondary | ICD-10-CM | POA: Diagnosis not present

## 2018-09-24 DIAGNOSIS — E8881 Metabolic syndrome: Secondary | ICD-10-CM | POA: Diagnosis not present

## 2018-09-24 DIAGNOSIS — E88819 Insulin resistance, unspecified: Secondary | ICD-10-CM

## 2018-09-24 DIAGNOSIS — Z6836 Body mass index (BMI) 36.0-36.9, adult: Secondary | ICD-10-CM

## 2018-09-24 MED ORDER — VITAMIN D (ERGOCALCIFEROL) 1.25 MG (50000 UNIT) PO CAPS: 50000.0000 [IU] | ORAL_CAPSULE | ORAL | 0 refills | Status: DC

## 2018-09-25 NOTE — Progress Notes (Signed)
Office: [phone]  /  Fax: [phone]   HPI:   Chief Complaint: OBESITY Diane [name] is here to discuss her progress with her obesity treatment plan. She is on the  follow the Category 2 plan +100 calories and is following her eating plan approximately 100 % of the time. She states she is exercising 0 minutes 0 times per week. Diane [name] did well with weight loss but is currently struggling with meal planning especially for dinner. She did add some extra sauces and substituted frequently as well as skipping some food. Although she states she followed her plan 100% it was more like 50%. She did try to be mindful about food choices. Her family did not eat healthy with her.  Her weight is 205 lb (93 kg) today and has had a weight loss of 4 pounds over a period of 2 weeks since her last visit. She has lost 4 lbs since starting treatment with [place].  Vitamin D deficiency Diane [name] has a new diagnosis of vitamin D deficiency. She is not currently taking vit D and denies nausea, vomiting or muscle weakness. She reports fatigue.   Insulin Resistance Diane [name] has a new diagnosis of insulin resistance based on her elevated fasting insulin level >5. Although [name]'s blood glucose readings are still under good control, insulin resistance puts her at greater risk of metabolic syndrome and diabetes. She is not taking metformin currently and continues to work on diet and exercise to decrease risk of diabetes. She admits to polyphagia.   At risk for diabetes Diane [name] is at higher than averagerisk for developing diabetes due to her obesity. She currently denies polyuria or polydipsia.  ALLERGIES: No Known Allergies  MEDICATIONS: Current Outpatient Medications on File Prior to Visit  Medication Sig Dispense Refill  . buPROPion (WELLBUTRIN SR) 100 MG 12 hr tablet Take 100 mg by mouth 2 (two) times daily.    . Chlorphen-PE-Acetaminophen (NOREL AD) 4-10-325 MG TABS Take 1 tablet by mouth. Take one  tablet by mouth every 4-6 hours as needed    . divalproex (DEPAKOTE) 250 MG DR tablet Take 250 mg by mouth 3 (three) times daily as needed.    Marland Kitchen escitalopram (LEXAPRO) 20 MG tablet Take 20 mg by mouth daily.     Marland Kitchen estradiol (VIVELLE-DOT) 0.1 MG/24HR patch Place 1 patch onto the skin 2 (two) times a week. Apply one patch 2 x a week during menstration    . fluticasone (FLONASE) 50 MCG/ACT nasal spray Place 2 sprays into both nostrils daily. 16 g 6  . ibuprofen (ADVIL,MOTRIN) 600 MG tablet Take 600 mg by mouth every 6 (six) hours as needed.    . norgestimate-ethinyl estradiol (ORTHO-CYCLEN,SPRINTEC,PREVIFEM) 0.25-35 MG-MCG tablet Take 1 tablet by mouth daily.    . SUMAtriptan (IMITREX) 25 MG tablet Take 25 mg by mouth every 2 (two) hours as needed for migraine. May repeat in 2 hours if headache persists or recurs.     No current facility-administered medications on file prior to visit.     PAST MEDICAL HISTORY: Past Medical History:  Diagnosis Date  . Allergy    seasonal allergies  . Anxiety   . Arthritis    knees bilaterally  . Asthma    coincides with respiratory illness  . Back pain   . Constipation   . Depression   . Ectopic pregnancy without intrauterine pregnancy 2013  . GERD (gastroesophageal reflux disease)   . Headache(784.0)    migraines  . Joint pain   . Kidney stone 2009  .  Lactose intolerance   . Leg edema   . Rheumatoid arthritis (HCC)   . SVD (spontaneous vaginal delivery) 02/25/2014  . Vitamin D deficiency     PAST SURGICAL HISTORY: Past Surgical History:  Procedure Laterality Date  . DILATION AND CURETTAGE OF UTERUS  11/18/2012   Procedure: DILATATION AND CURETTAGE;  Surgeon: Serita Kyle, MD;  Location: [facility];  Service: Gynecology;  Laterality: N/A;  . falopian tube removed  December 2013   had a tubal pregnancy  . LAPAROSCOPY  11/18/2012   Procedure: LAPAROSCOPY OPERATIVE;  Surgeon: Serita Kyle, MD;  Location: [facility];  Service: Gynecology;   Laterality: N/A;  Operative Laparoscopy with removal of left fallopian tube and ectopic pregnancy, Dilation and evacuation    SOCIAL HISTORY: Social History   Tobacco Use  . Smoking status: Former Smoker    Types: Cigarettes  . Smokeless tobacco: Never Used  . Tobacco comment: 2014  Substance Use Topics  . Alcohol use: No  . Drug use: No    FAMILY HISTORY: Family History  Problem Relation Age of Onset  . Asthma Mother   . Obesity Mother   . Hypertension Mother   . Diabetes Father   . Hypertension Father   . Asthma Brother   . Asthma Daughter   . Cancer Maternal Grandmother   . Diabetes Paternal Grandmother   . Hypertension Paternal Grandmother   . Diabetes Paternal Grandfather   . Hypertension Paternal Grandfather   . Hearing loss Paternal Grandfather        work related  . Other Neg Hx     ROS: Review of Systems  Constitutional: Positive for weight loss.  Gastrointestinal: Negative for nausea and vomiting.  Musculoskeletal:       Negative for muscle weakness  Endo/Heme/Allergies: Negative for polydipsia.       Negative for polyuria  Positive for polyphagia     PHYSICAL EXAM: Blood pressure 121/81, pulse 84, temperature 98.1 F (36.7 C), temperature source Oral, height 5\' 3"  (1.6 m), weight 205 lb (93 kg), last menstrual period 08/25/2018, SpO2 99 %, unknown if currently breastfeeding. Body mass index is 36.31 kg/m. Physical Exam  Constitutional: She is oriented to person, place, and time. She appears well-developed and well-nourished.  HENT:  Head: Normocephalic.  Neck: Normal range of motion.  Cardiovascular: Normal rate.  Pulmonary/Chest: Effort normal.  Musculoskeletal: Normal range of motion.  Neurological: She is alert and oriented to person, place, and time.  Skin: Skin is warm and dry.  Psychiatric: She has a normal mood and affect. Her behavior is normal.  Vitals reviewed.   RECENT LABS AND TESTS: BMET    Component Value Date/Time   NA  141 09/10/2018 1001   K 4.3 09/10/2018 1001   CL 100 09/10/2018 1001   CO2 24 09/10/2018 1001   GLUCOSE 82 09/10/2018 1001   GLUCOSE 93 11/18/2012 0610   BUN 9 09/10/2018 1001   CREATININE 1.11 (H) 09/10/2018 1001   CALCIUM 9.5 09/10/2018 1001   GFRNONAA 66 09/10/2018 1001   GFRAA 76 09/10/2018 1001   Lab Results  Component Value Date   HGBA1C 5.6 09/10/2018   Lab Results  Component Value Date   INSULIN 23.3 09/10/2018   CBC    Component Value Date/Time   WBC 10.5 09/10/2018 1001   WBC 19.0 (H) 02/25/2014 0611   RBC 4.54 09/10/2018 1001   RBC 3.38 (L) 02/25/2014 0611   HGB 12.4 09/10/2018 1001   HCT 38.3 09/10/2018 1001  PLT 155 02/25/2014 0611   MCV 84 09/10/2018 1001   MCH 27.3 09/10/2018 1001   MCH 29.3 02/25/2014 0611   MCHC 32.4 09/10/2018 1001   MCHC 34.5 02/25/2014 0611   RDW 14.3 09/10/2018 1001   LYMPHSABS 1.8 09/10/2018 1001   MONOABS 1.1 (H) 11/18/2012 0610   EOSABS 0.3 09/10/2018 1001   BASOSABS 0.0 09/10/2018 1001   Iron/TIBC/Ferritin/ %Sat No results found for: IRON, TIBC, FERRITIN, IRONPCTSAT Lipid Panel     Component Value Date/Time   CHOL 207 (H) 09/10/2018 1001   TRIG 121 09/10/2018 1001   HDL 75 09/10/2018 1001   LDLCALC 108 (H) 09/10/2018 1001   Hepatic Function Panel     Component Value Date/Time   PROT 7.2 09/10/2018 1001   ALBUMIN 4.0 09/10/2018 1001   AST 13 09/10/2018 1001   ALT 9 09/10/2018 1001   ALKPHOS 59 09/10/2018 1001   BILITOT <0.2 09/10/2018 1001      Component Value Date/Time   TSH 5.110 (H) 09/10/2018 1001    Ref. Range 09/10/2018 10:01  Vitamin D, 25-Hydroxy Latest Ref Range: 30.0 - 100.0 ng/mL 24.6 (L)    ASSESSMENT AND PLAN: Vitamin D deficiency - Plan: Vitamin D, Ergocalciferol, (DRISDOL) 50000 units CAPS capsule  Insulin resistance  At risk for diabetes mellitus  Class 2 severe obesity with serious comorbidity and body mass index (BMI) of 36.0 to 36.9 in adult, unspecified obesity type  (HCC)  PLAN: Vitamin D Deficiency Xaviera was informed that low vitamin D levels contributes to fatigue and are associated with obesity, breast, and colon cancer. She agrees to start to take prescription Vit D @50 ,000 IU every week #4 with no refills and will follow up for routine testing of vitamin D, at least 2-3 times per year. She was informed of the risk of over-replacement of vitamin D and agrees to not increase her dose unless she discusses this with [place] first.Agrees to follow up with our clinic as directed.   Insulin Resistance Mirely will continue to work on weight loss, exercise, and decreasing simple carbohydrates in her diet to help decrease the risk of diabetes. We dicussed metformin including benefits and risks. She was informed that eating too many simple carbohydrates or too many calories at one sitting increases the likelihood of GI side effects. Damian declined metformin for now and prescription was not written today. Danialle agreed to follow up with [place] as directed to monitor her progress. We will repeat labs in 3 months.   Diabetes II Chelsa has been given extensive diabetes education by myself today including ideal fasting and post-prandial blood glucose readings, individual ideal HgA1c goals  and hypoglycemia prevention. We discussed the importance of good blood sugar control to decrease the likelihood of diabetic complications such as nephropathy, neuropathy, limb loss, blindness, coronary artery disease, and death. We discussed the importance of intensive lifestyle modification including diet, exercise and weight loss as the first line treatment for diabetes. Deniyah agrees to continue her diabetes medications and will follow up at the agreed upon time.   Obesity Jakiyah is currently in the action stage of change. As such, her goal is to continue with weight loss efforts She has agreed to follow the Category 2 plan Saquoia has been instructed to work up to a  goal of 150 minutes of combined cardio and strengthening exercise per week for weight loss and overall health benefits. We discussed the following Behavioral Modification Strategies today: increasing lean protein intake, no skipping meals, family/coworker sabotage,  and work on meal planning and easy cooking plans.  Taniah has agreed to follow up with our clinic in 2 weeks. She was informed of the importance of frequent follow up visits to maximize her success with intensive lifestyle modifications for her multiple health conditions.   OBESITY BEHAVIORAL INTERVENTION VISIT  Today's visit was # 2   Starting weight: 209 lb Starting date: 09/10/18 Today's weight : 205 lb Today's date: 09/24/18 Total lbs lost to date: 4 lb    ASK: We discussed the diagnosis of obesity with Kayleena D Bye today and Myana agreed to give [place] permission to discuss obesity behavioral modification therapy today.  ASSESS: Ahana has the diagnosis of obesity and her BMI today is 36.32 Davina is in the action stage of change   ADVISE: Jelissa was educated on the multiple health risks of obesity as well as the benefit of weight loss to improve her health. She was advised of the need for long term treatment and the importance of lifestyle modifications to improve her current health and to decrease her risk of future health problems.  AGREE: Multiple dietary modification options and treatment options were discussed and  Crystalina agreed to follow the recommendations documented in the above note.  ARRANGE: Vivika was educated on the importance of frequent visits to treat obesity as outlined per CMS and USPSTF guidelines and agreed to schedule her next follow up appointment today.  I, Jeralene Peters, am acting as transcriptionist for Quillian Quince, MD   I have reviewed the above documentation for accuracy and completeness, and I agree with the above. -Quillian Quince, MD

## 2018-10-04 ENCOUNTER — Encounter (INDEPENDENT_AMBULATORY_CARE_PROVIDER_SITE_OTHER): Payer: Self-pay

## 2018-10-08 ENCOUNTER — Ambulatory Visit (INDEPENDENT_AMBULATORY_CARE_PROVIDER_SITE_OTHER): Payer: BC Managed Care – PPO | Admitting: Family Medicine

## 2018-10-10 ENCOUNTER — Encounter (INDEPENDENT_AMBULATORY_CARE_PROVIDER_SITE_OTHER): Payer: Self-pay | Admitting: Family Medicine

## 2018-10-10 ENCOUNTER — Ambulatory Visit (INDEPENDENT_AMBULATORY_CARE_PROVIDER_SITE_OTHER): Payer: BC Managed Care – PPO | Admitting: Family Medicine

## 2018-10-10 VITALS — BP 120/76 | HR 89 | Temp 98.2°F | Ht 63.0 in | Wt 202.0 lb

## 2018-10-10 DIAGNOSIS — E669 Obesity, unspecified: Secondary | ICD-10-CM | POA: Diagnosis not present

## 2018-10-10 DIAGNOSIS — E8881 Metabolic syndrome: Secondary | ICD-10-CM

## 2018-10-10 DIAGNOSIS — E559 Vitamin D deficiency, unspecified: Secondary | ICD-10-CM

## 2018-10-10 DIAGNOSIS — Z6835 Body mass index (BMI) 35.0-35.9, adult: Secondary | ICD-10-CM

## 2018-10-10 DIAGNOSIS — E88819 Insulin resistance, unspecified: Secondary | ICD-10-CM

## 2018-10-10 NOTE — Progress Notes (Signed)
Office: [phone]  /  Fax: [phone]   HPI:   Chief Complaint: OBESITY Diane [name] is here to discuss her progress with her obesity treatment plan. She is on the  follow the Category 2 plan plus 100 calories and is following her eating plan approximately 100% of the time. She states she is exercising 0 minutes 0 times per week. She is eating all of the food as prescribed on her meal plan. She has cut out [facility] and reports migraine frequency has not increased.  Angelyn is currently struggling with occasional sweets cravings. She currently takes Wellbutrin for depression.  She denies emotional eating. Her weight is 202 lb (91.6 kg) today and has had a weight loss of 3 pounds over a period of 2 weeks since her last visit. She has lost 7 lbs since starting treatment with [place].  Insulin Resistance Diane [name] has a diagnosis of insulin resistance based on her elevated fasting insulin level of 23. Although Diane [name]'s blood glucose readings are still under good control, insulin resistance puts her at greater risk of metabolic syndrome and diabetes. She has occasional polyphagia and sweets cravings. She is not taking metformin currently and continues to work on diet and exercise to decrease risk of diabetes.  Vitamin D deficiency Diane [name] has a diagnosis of vitamin D deficiency. She is currently taking vit D and denies nausea, vomiting or muscle weakness.  ALLERGIES: No Known Allergies  MEDICATIONS: Current Outpatient Medications on File Prior to Visit  Medication Sig Dispense Refill  . buPROPion (WELLBUTRIN SR) 100 MG 12 hr tablet Take 100 mg by mouth 2 (two) times daily.    . Chlorphen-PE-Acetaminophen (NOREL AD) 4-10-325 MG TABS Take 1 tablet by mouth. Take one tablet by mouth every 4-6 hours as needed    . divalproex (DEPAKOTE) 250 MG DR tablet Take 250 mg by mouth 3 (three) times daily as needed.    Marland Kitchen escitalopram (LEXAPRO) 20 MG tablet Take 20 mg by mouth daily.     Marland Kitchen  estradiol (VIVELLE-DOT) 0.1 MG/24HR patch Place 1 patch onto the skin 2 (two) times a week. Apply one patch 2 x a week during menstration    . fluticasone (FLONASE) 50 MCG/ACT nasal spray Place 2 sprays into both nostrils daily. 16 g 6  . ibuprofen (ADVIL,MOTRIN) 600 MG tablet Take 600 mg by mouth every 6 (six) hours as needed.    . norgestimate-ethinyl estradiol (ORTHO-CYCLEN,SPRINTEC,PREVIFEM) 0.25-35 MG-MCG tablet Take 1 tablet by mouth daily.    . SUMAtriptan (IMITREX) 25 MG tablet Take 25 mg by mouth every 2 (two) hours as needed for migraine. May repeat in 2 hours if headache persists or recurs.    . Vitamin D, Ergocalciferol, (DRISDOL) 50000 units CAPS capsule Take 1 capsule (50,000 Units total) by mouth every 7 (seven) days. 4 capsule 0   No current facility-administered medications on file prior to visit.     PAST MEDICAL HISTORY: Past Medical History:  Diagnosis Date  . Allergy    seasonal allergies  . Anxiety   . Arthritis    knees bilaterally  . Asthma    coincides with respiratory illness  . Back pain   . Constipation   . Depression   . Ectopic pregnancy without intrauterine pregnancy 2013  . GERD (gastroesophageal reflux disease)   . Headache(784.0)    migraines  . Joint pain   . Kidney stone 2009  . Lactose intolerance   . Leg edema   . Rheumatoid arthritis (HCC)   . SVD (  spontaneous vaginal delivery) 02/25/2014  . Vitamin D deficiency     PAST SURGICAL HISTORY: Past Surgical History:  Procedure Laterality Date  . DILATION AND CURETTAGE OF UTERUS  11/18/2012   Procedure: DILATATION AND CURETTAGE;  Surgeon: Serita Kyle, MD;  Location: [facility];  Service: Gynecology;  Laterality: N/A;  . falopian tube removed  December 2013   had a tubal pregnancy  . LAPAROSCOPY  11/18/2012   Procedure: LAPAROSCOPY OPERATIVE;  Surgeon: Serita Kyle, MD;  Location: [facility];  Service: Gynecology;  Laterality: N/A;  Operative Laparoscopy with removal of left fallopian  tube and ectopic pregnancy, Dilation and evacuation    SOCIAL HISTORY: Social History   Tobacco Use  . Smoking status: Former Smoker    Types: Cigarettes  . Smokeless tobacco: Never Used  . Tobacco comment: 2014  Substance Use Topics  . Alcohol use: No  . Drug use: No    FAMILY HISTORY: Family History  Problem Relation Age of Onset  . Asthma Mother   . Obesity Mother   . Hypertension Mother   . Diabetes Father   . Hypertension Father   . Asthma Brother   . Asthma Daughter   . Cancer Maternal Grandmother   . Diabetes Paternal Grandmother   . Hypertension Paternal Grandmother   . Diabetes Paternal Grandfather   . Hypertension Paternal Grandfather   . Hearing loss Paternal Grandfather        work related  . Other Neg Hx     ROS: Review of Systems  Constitutional: Positive for weight loss.  Gastrointestinal: Negative for nausea and vomiting.  Neurological: Negative for weakness.  Endo/Heme/Allergies:       + occasional polyphagia.  Psychiatric/Behavioral:       Occasional food cravings.    PHYSICAL EXAM: Blood pressure 120/76, pulse 89, temperature 98.2 F (36.8 C), temperature source Oral, height 5\' 3"  (1.6 m), weight 202 lb (91.6 kg), SpO2 100 %, unknown if currently breastfeeding. Body mass index is 35.78 kg/m. Physical Exam  Constitutional: She is oriented to person, place, and time. She appears well-developed and well-nourished.  Cardiovascular: Normal rate.  Pulmonary/Chest: Effort normal.  Musculoskeletal: Normal range of motion.  Neurological: She is alert and oriented to person, place, and time.  Skin: Skin is warm and dry.  Psychiatric: She has a normal mood and affect. Her behavior is normal.  Vitals reviewed.   RECENT LABS AND TESTS: BMET    Component Value Date/Time   NA 141 09/10/2018 1001   K 4.3 09/10/2018 1001   CL 100 09/10/2018 1001   CO2 24 09/10/2018 1001   GLUCOSE 82 09/10/2018 1001   GLUCOSE 93 11/18/2012 0610   BUN 9  09/10/2018 1001   CREATININE 1.11 (H) 09/10/2018 1001   CALCIUM 9.5 09/10/2018 1001   GFRNONAA 66 09/10/2018 1001   GFRAA 76 09/10/2018 1001   Lab Results  Component Value Date   HGBA1C 5.6 09/10/2018   Lab Results  Component Value Date   INSULIN 23.3 09/10/2018   CBC    Component Value Date/Time   WBC 10.5 09/10/2018 1001   WBC 19.0 (H) 02/25/2014 0611   RBC 4.54 09/10/2018 1001   RBC 3.38 (L) 02/25/2014 0611   HGB 12.4 09/10/2018 1001   HCT 38.3 09/10/2018 1001   PLT 155 02/25/2014 0611   MCV 84 09/10/2018 1001   MCH 27.3 09/10/2018 1001   MCH 29.3 02/25/2014 0611   MCHC 32.4 09/10/2018 1001   MCHC 34.5 02/25/2014 0611  RDW 14.3 09/10/2018 1001   LYMPHSABS 1.8 09/10/2018 1001   MONOABS 1.1 (H) 11/18/2012 0610   EOSABS 0.3 09/10/2018 1001   BASOSABS 0.0 09/10/2018 1001   Iron/TIBC/Ferritin/ %Sat No results found for: IRON, TIBC, FERRITIN, IRONPCTSAT Lipid Panel     Component Value Date/Time   CHOL 207 (H) 09/10/2018 1001   TRIG 121 09/10/2018 1001   HDL 75 09/10/2018 1001   LDLCALC 108 (H) 09/10/2018 1001   Hepatic Function Panel     Component Value Date/Time   PROT 7.2 09/10/2018 1001   ALBUMIN 4.0 09/10/2018 1001   AST 13 09/10/2018 1001   ALT 9 09/10/2018 1001   ALKPHOS 59 09/10/2018 1001   BILITOT <0.2 09/10/2018 1001      Component Value Date/Time   TSH 5.110 (H) 09/10/2018 1001  Results for Diane, [name] (MRN [id number]) as of 10/10/2018 15:29  Ref. Range 09/10/2018 10:01  Vitamin D, 25-Hydroxy Latest Ref Range: 30.0 - 100.0 ng/mL 24.6 (L)    ASSESSMENT AND PLAN: Insulin resistance  Vitamin D deficiency  Class 2 obesity without serious comorbidity with body mass index (BMI) of 35.0 to 35.9 in adult, unspecified obesity type  PLAN:  Insulin Resistance Sharne will continue to work on weight loss, exercise, and decreasing simple carbohydrates in her diet to help decrease the risk of diabetes. We dicussed metformin including benefits  and risks. She was informed that eating too many simple carbohydrates or too many calories at one sitting increases the likelihood of GI side effects. Diane [name] declined metformin for now and prescription was not written today. Diane [name] agreed to follow up with [place] as directed to monitor her progress.  Vitamin D Deficiency Diane [name] was informed that low vitamin D levels contributes to fatigue and are associated with obesity, breast, and colon cancer. She agrees to continue to take prescription Vit D @50 ,000 IU every week and will follow up for routine testing of vitamin D, at least 2-3 times per year. She was informed of the risk of over-replacement of vitamin D and agrees to not increase her dose unless she discusses this with [place] first.  Obesity Diane [name] is currently in the action stage of change. As such, her goal is to continue with weight loss efforts She has agreed to follow the Category 2 plan plus 100 calories. Diane [name] has not been prescribed exercise at this time. We discussed the following Behavioral Modification Strategies today: holiday eating strategies , avoiding temptation, and planning for success.  I spent > than 50% of the 15 minute visit on counseling as documented in the note.  Diane [name] has agreed to follow up with our clinic in 2 weeks. She was informed of the importance of frequent follow up visits to maximize her success with intensive lifestyle modifications for her multiple health conditions.   OBESITY BEHAVIORAL INTERVENTION VISIT  Today's visit was # 3  Starting weight: 209 lbs Starting date: 09/10/18 Today's weight : Weight: 202 lb (91.6 kg)  Today's date: 10/10/2018 Total lbs lost to date: 7   ASK: We discussed the diagnosis of obesity with Diane [name] today and Diane [name] agreed to give [place] permission to discuss obesity behavioral modification therapy today.  ASSESS: Diane [name] has the diagnosis of obesity and her BMI today is 35.8. Diane [name] is in  the action stage of change   ADVISE: Diane [name] was educated on the multiple health risks of obesity as well as the benefit of weight loss to improve her health. She was advised of the need for  long term treatment and the importance of lifestyle modifications to improve her current health and to decrease her risk of future health problems.  AGREE: Multiple dietary modification options and treatment options were discussed and  Diane [name] agreed to follow the recommendations documented in the above note.  ARRANGE: Diane [name] was educated on the importance of frequent visits to treat obesity as outlined per CMS and USPSTF guidelines and agreed to schedule her next follow up appointment today.

## 2018-10-18 ENCOUNTER — Other Ambulatory Visit (INDEPENDENT_AMBULATORY_CARE_PROVIDER_SITE_OTHER): Payer: Self-pay | Admitting: Family Medicine

## 2018-10-18 DIAGNOSIS — E559 Vitamin D deficiency, unspecified: Secondary | ICD-10-CM

## 2018-10-25 ENCOUNTER — Ambulatory Visit (INDEPENDENT_AMBULATORY_CARE_PROVIDER_SITE_OTHER): Payer: BC Managed Care – PPO | Admitting: Family Medicine

## 2018-10-25 ENCOUNTER — Encounter (INDEPENDENT_AMBULATORY_CARE_PROVIDER_SITE_OTHER): Payer: Self-pay | Admitting: Family Medicine

## 2018-10-25 VITALS — BP 135/79 | HR 86 | Temp 98.1°F | Ht 63.0 in | Wt 199.0 lb

## 2018-10-25 DIAGNOSIS — E8881 Metabolic syndrome: Secondary | ICD-10-CM | POA: Diagnosis not present

## 2018-10-25 DIAGNOSIS — Z9189 Other specified personal risk factors, not elsewhere classified: Secondary | ICD-10-CM

## 2018-10-25 DIAGNOSIS — E559 Vitamin D deficiency, unspecified: Secondary | ICD-10-CM

## 2018-10-25 DIAGNOSIS — E88819 Insulin resistance, unspecified: Secondary | ICD-10-CM

## 2018-10-25 DIAGNOSIS — Z6835 Body mass index (BMI) 35.0-35.9, adult: Secondary | ICD-10-CM

## 2018-10-25 MED ORDER — VITAMIN D (ERGOCALCIFEROL) 1.25 MG (50000 UNIT) PO CAPS: 50000.0000 [IU] | ORAL_CAPSULE | ORAL | 0 refills | Status: DC

## 2018-10-30 ENCOUNTER — Encounter (INDEPENDENT_AMBULATORY_CARE_PROVIDER_SITE_OTHER): Payer: Self-pay | Admitting: Family Medicine

## 2018-10-30 NOTE — Progress Notes (Signed)
Office: [phone]  /  Fax: [phone]   HPI:   Chief Complaint: OBESITY Diane [name] is here to discuss her progress with her obesity treatment plan. She is on the Category 2 plan + 100 calories and is following her eating plan approximately 75% of the time. She states she is exercising 0 minutes 0 times per week. Diane [name] had a few family celebrations recently and has had cake and lasagna. She is not eating all of the protein prescribed on her plan. Her weight is 199 lb (90.3 kg) today and has had a weight loss of 3 pounds over a period of 2 weeks since her last visit. She has lost 10 lbs since starting treatment with [place].  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 She is stable on prescription Vit [name], but level is not at goal. She denies nausea, vomiting or muscle weakness.  At risk for osteopenia and osteoporosis Diane [name] is at higher risk of osteopenia and osteoporosis due to vitamin [name] deficiency.   Insulin Resistance Diane [name] has a diagnosis of insulin resistance based on her elevated fasting insulin level >5. Although Diane [name]'s blood glucose readings are still under good control, insulin resistance puts her at greater risk of metabolic syndrome and diabetes. She is not taking metformin currently and continues to work on diet and exercise to decrease risk of diabetes. She denies polyphagia.  ALLERGIES: No Known Allergies  MEDICATIONS: Current Outpatient Medications on File Prior to Visit  Medication Sig Dispense Refill  . buPROPion (WELLBUTRIN SR) 100 MG 12 hr tablet Take 100 mg by mouth 2 (two) times daily.    . Chlorphen-PE-Acetaminophen (NOREL AD) 4-10-325 MG TABS Take 1 tablet by mouth. Take one tablet by mouth every 4-6 hours as needed    . divalproex (DEPAKOTE) 250 MG DR tablet Take 250 mg by mouth 3 (three) times daily as needed.    Marland Kitchen escitalopram (LEXAPRO) 20 MG tablet Take 20 mg by mouth daily.     Marland Kitchen estradiol (VIVELLE-DOT) 0.1 MG/24HR  patch Place 1 patch onto the skin 2 (two) times a week. Apply one patch 2 x a week during menstration    . fluticasone (FLONASE) 50 MCG/ACT nasal spray Place 2 sprays into both nostrils daily. 16 g 6  . ibuprofen (ADVIL,MOTRIN) 600 MG tablet Take 600 mg by mouth every 6 (six) hours as needed.    . norgestimate-ethinyl estradiol (ORTHO-CYCLEN,SPRINTEC,PREVIFEM) 0.25-35 MG-MCG tablet Take 1 tablet by mouth daily.    . SUMAtriptan (IMITREX) 25 MG tablet Take 25 mg by mouth every 2 (two) hours as needed for migraine. May repeat in 2 hours if headache persists or recurs.     No current facility-administered medications on file prior to visit.     PAST MEDICAL HISTORY: Past Medical History:  Diagnosis Date  . Allergy    seasonal allergies  . Anxiety   . Arthritis    knees bilaterally  . Asthma    coincides with respiratory illness  . Back pain   . Constipation   . Depression   . Ectopic pregnancy without intrauterine pregnancy 2013  . GERD (gastroesophageal reflux disease)   . Headache(784.0)    migraines  . Joint pain   . Kidney stone 2009  . Lactose intolerance   . Leg edema   . Rheumatoid arthritis (HCC)   . SVD (spontaneous vaginal delivery) 02/25/2014  . Vitamin [name] deficiency     PAST SURGICAL HISTORY: Past Surgical History:  Procedure Laterality Date  .  DILATION AND CURETTAGE OF UTERUS  11/18/2012   Procedure: DILATATION AND CURETTAGE;  Surgeon: Serita Kyle, MD;  Location: [facility];  Service: Gynecology;  Laterality: N/A;  . falopian tube removed  December 2013   had a tubal pregnancy  . LAPAROSCOPY  11/18/2012   Procedure: LAPAROSCOPY OPERATIVE;  Surgeon: Serita Kyle, MD;  Location: [facility];  Service: Gynecology;  Laterality: N/A;  Operative Laparoscopy with removal of left fallopian tube and ectopic pregnancy, Dilation and evacuation    SOCIAL HISTORY: Social History   Tobacco Use  . Smoking status: Former Smoker    Types: Cigarettes  . Smokeless  tobacco: Never Used  . Tobacco comment: 2014  Substance Use Topics  . Alcohol use: No  . Drug use: No    FAMILY HISTORY: Family History  Problem Relation Age of Onset  . Asthma Mother   . Obesity Mother   . Hypertension Mother   . Diabetes Father   . Hypertension Father   . Asthma Brother   . Asthma Daughter   . Cancer Maternal Grandmother   . Diabetes Paternal Grandmother   . Hypertension Paternal Grandmother   . Diabetes Paternal Grandfather   . Hypertension Paternal Grandfather   . Hearing loss Paternal Grandfather        work related  . Other Neg Hx     ROS: Review of Systems  Constitutional: Positive for weight loss.  Gastrointestinal: Negative for nausea and vomiting.  Musculoskeletal:       Negative muscle weakness  Endo/Heme/Allergies:       Negative polyphagia    PHYSICAL EXAM: Blood pressure 135/79, pulse 86, temperature 98.1 F (36.7 C), temperature source Oral, height 5\' 3"  (1.6 m), weight 199 lb (90.3 kg), SpO2 100 %, unknown if currently breastfeeding. Body mass index is 35.25 kg/m. Physical Exam  Constitutional: She is oriented to person, place, and time. She appears well-developed and well-nourished.  Cardiovascular: Normal rate.  Pulmonary/Chest: Effort normal.  Musculoskeletal: Normal range of motion.  Neurological: She is oriented to person, place, and time.  Skin: Skin is warm and dry.  Psychiatric: She has a normal mood and affect. Her behavior is normal.  Vitals reviewed.   RECENT LABS AND TESTS: BMET    Component Value Date/Time   NA 141 09/10/2018 1001   K 4.3 09/10/2018 1001   CL 100 09/10/2018 1001   CO2 24 09/10/2018 1001   GLUCOSE 82 09/10/2018 1001   GLUCOSE 93 11/18/2012 0610   BUN 9 09/10/2018 1001   CREATININE 1.11 (H) 09/10/2018 1001   CALCIUM 9.5 09/10/2018 1001   GFRNONAA 66 09/10/2018 1001   GFRAA 76 09/10/2018 1001   Lab Results  Component Value Date   HGBA1C 5.6 09/10/2018   Lab Results  Component Value  Date   INSULIN 23.3 09/10/2018   CBC    Component Value Date/Time   WBC 10.5 09/10/2018 1001   WBC 19.0 (H) 02/25/2014 0611   RBC 4.54 09/10/2018 1001   RBC 3.38 (L) 02/25/2014 0611   HGB 12.4 09/10/2018 1001   HCT 38.3 09/10/2018 1001   PLT 155 02/25/2014 0611   MCV 84 09/10/2018 1001   MCH 27.3 09/10/2018 1001   MCH 29.3 02/25/2014 0611   MCHC 32.4 09/10/2018 1001   MCHC 34.5 02/25/2014 0611   RDW 14.3 09/10/2018 1001   LYMPHSABS 1.8 09/10/2018 1001   MONOABS 1.1 (H) 11/18/2012 0610   EOSABS 0.3 09/10/2018 1001   BASOSABS 0.0 09/10/2018 1001  Iron/TIBC/Ferritin/ %Sat No results found for: IRON, TIBC, FERRITIN, IRONPCTSAT Lipid Panel     Component Value Date/Time   CHOL 207 (H) 09/10/2018 1001   TRIG 121 09/10/2018 1001   HDL 75 09/10/2018 1001   LDLCALC 108 (H) 09/10/2018 1001   Hepatic Function Panel     Component Value Date/Time   PROT 7.2 09/10/2018 1001   ALBUMIN 4.0 09/10/2018 1001   AST 13 09/10/2018 1001   ALT 9 09/10/2018 1001   ALKPHOS 59 09/10/2018 1001   BILITOT <0.2 09/10/2018 1001      Component Value Date/Time   TSH 5.110 (H) 09/10/2018 1001    ASSESSMENT AND PLAN: Vitamin [name] deficiency - Plan: Vitamin [name], Ergocalciferol, (DRISDOL) 1.25 MG (50000 UT) CAPS capsule  Insulin resistance  At risk for osteoporosis  Class 2 severe obesity with serious comorbidity and body mass index (BMI) of 35.0 to 35.9 in adult, unspecified obesity type (HCC)  PLAN:  Vitamin [name] Deficiency Diane [name] was informed that low vitamin [name] levels contributes to fatigue and are associated with obesity, breast, and colon cancer. Diane [name] agrees to continue taking prescription Vit [name] @50 ,000 IU every week #4 and we will refill for 1 month. She will follow up for routine testing of vitamin [name], at least 2-3 times per year. She was informed of the risk of over-replacement of vitamin [name] and agrees to not increase her dose unless she discusses this with [place] first. We will recheck  level in 1 month. Diane [name] agrees to follow up with our clinic in 2 weeks.   At risk for osteopenia and osteoporosis Diane [name] was given extended (15 minutes) osteoporosis prevention counseling today. Diane [name] is at risk for osteopenia and osteoporsis due to her vitamin [name] deficiency. She was encouraged to take her vitamin [name] and follow her higher calcium diet and increase strengthening exercise to help strengthen her bones and decrease her risk of osteopenia and osteoporosis.  Insulin Resistance Diane [name] will continue to work on weight loss, diet, exercise, and decreasing simple carbohydrates in her diet to help decrease the risk of diabetes. We dicussed metformin including benefits and risks. She was informed that eating too many simple carbohydrates or too many calories at one sitting increases the likelihood of GI side effects. Diane [name] declined metformin for now and prescription was not written today. We will recheck labs in 1 month. Diane [name] agrees to follow up with our clinic in 2 weeks as directed to monitor her progress.  Obesity Diane [name] is currently in the action stage of change. As such, her goal is to continue with weight loss efforts She has agreed to keep a food journal with 400-500 calories and 35+ grams of protein at supper daily and follow the Category 2 plan + 100 calories with breakfast options Diane [name] has not been prescribed exercise at this time.  We discussed the following Behavioral Modification Strategies today: increasing lean protein intake, better snacking choices, and planning for success "Protein Content" handout was provided.  Diane [name] has agreed to follow up with our clinic in 2 weeks. She was informed of the importance of frequent follow up visits to maximize her success with intensive lifestyle modifications for her multiple health conditions.   OBESITY BEHAVIORAL INTERVENTION VISIT  Today's visit was # 4  Starting weight: 209 lbs Starting date:  09/10/18 Today's weight : 199 lbs  Today's date: 10/25/2018 Total lbs lost to date: 10    ASK: We discussed the diagnosis of obesity with Diane [name] On today and Diane [name] agreed to  give us permission to discuss obesity behavioral modification therapy today.  ASSESS: Diane [name] has the diagnosis of obesity and her BMI today is 35.26 Diane [name] is in the action stage of change   ADVISE: Diane [name] was educated on the multiple health risks of obesity as well as the benefit of weight loss to improve her health. She was advised of the need for long term treatment and the importance of lifestyle modifications to improve her current health and to decrease her risk of future health problems.  AGREE: Multiple dietary modification options and treatment options were discussed and  Shary agreed to follow the recommendations documented in the above note.  ARRANGE: Rosangelica was educated on the importance of frequent visits to treat obesity as outlined per CMS and USPSTF guidelines and agreed to schedule her next follow up appointment today.  Trude McburneyI, Sharon Martin, am acting as Energy managertranscriptionist for [place] , FNP-C.  I have reviewed the above documentation for accuracy and completeness, and I agree with the above.  -  , FNP-C.

## 2018-11-12 ENCOUNTER — Ambulatory Visit (INDEPENDENT_AMBULATORY_CARE_PROVIDER_SITE_OTHER): Payer: BC Managed Care – PPO | Admitting: Family Medicine

## 2018-11-12 ENCOUNTER — Encounter (INDEPENDENT_AMBULATORY_CARE_PROVIDER_SITE_OTHER): Payer: Self-pay

## 2018-12-19 ENCOUNTER — Encounter (INDEPENDENT_AMBULATORY_CARE_PROVIDER_SITE_OTHER): Payer: Self-pay | Admitting: Family Medicine

## 2018-12-19 ENCOUNTER — Other Ambulatory Visit (INDEPENDENT_AMBULATORY_CARE_PROVIDER_SITE_OTHER): Payer: Self-pay | Admitting: Family Medicine

## 2018-12-19 DIAGNOSIS — E559 Vitamin D deficiency, unspecified: Secondary | ICD-10-CM

## 2018-12-25 ENCOUNTER — Ambulatory Visit (INDEPENDENT_AMBULATORY_CARE_PROVIDER_SITE_OTHER): Payer: BC Managed Care – PPO | Admitting: Physician Assistant

## 2018-12-25 ENCOUNTER — Encounter (INDEPENDENT_AMBULATORY_CARE_PROVIDER_SITE_OTHER): Payer: Self-pay | Admitting: Physician Assistant

## 2018-12-25 VITALS — BP 124/84 | HR 97 | Temp 98.1°F | Ht 63.0 in | Wt 202.0 lb

## 2018-12-25 DIAGNOSIS — E66812 Obesity, class 2: Secondary | ICD-10-CM

## 2018-12-25 DIAGNOSIS — Z9189 Other specified personal risk factors, not elsewhere classified: Secondary | ICD-10-CM

## 2018-12-25 DIAGNOSIS — E8881 Metabolic syndrome: Secondary | ICD-10-CM

## 2018-12-25 DIAGNOSIS — E88819 Insulin resistance, unspecified: Secondary | ICD-10-CM

## 2018-12-25 DIAGNOSIS — Z6835 Body mass index (BMI) 35.0-35.9, adult: Secondary | ICD-10-CM

## 2018-12-25 DIAGNOSIS — E559 Vitamin D deficiency, unspecified: Secondary | ICD-10-CM

## 2018-12-25 MED ORDER — VITAMIN D (ERGOCALCIFEROL) 1.25 MG (50000 UNIT) PO CAPS: 50000.0000 [IU] | ORAL_CAPSULE | ORAL | 0 refills | Status: DC

## 2018-12-25 MED ORDER — METFORMIN HCL 500 MG PO TABS: 500.0000 mg | ORAL_TABLET | Freq: Every day | ORAL | 0 refills | Status: DC

## 2018-12-26 NOTE — Progress Notes (Signed)
Office: [phone]  /  Fax: [phone]   HPI:   Chief Complaint: OBESITY Kathalene is here to discuss her progress with her obesity treatment plan. She is on the  keep a food journal with 400-500 calories and 35 grams protein with breakfast and is following her eating plan approximately 50 % of the time. She states she is exercising 0 minutes 0 times per week. Yumalay report struggling with her plan over the holidays. She is not getting enough protein in, especially at lunch. She is also missing vegetables at dinner time. Her weight is 202 lb (91.6 kg) today and has gained 3 lbs since her last visit. She has lost 7 lbs since starting treatment with [place].  Vitamin D deficiency Abbee has a diagnosis of vitamin D deficiency. She is currently taking prescription Vit D and denies nausea, vomiting or muscle weakness.  Insulin Resistance Sasha has a diagnosis of insulin resistance based on her elevated fasting insulin level >5. Although Annel's blood glucose readings are still under good control, insulin resistance puts her at greater risk of metabolic syndrome and diabetes. She is not taking metformin currently and continues to work on diet and exercise to decrease risk of diabetes. She reports cravings, especially at night.   At risk for osteopenia and osteoporosis Ashani is at higher risk of osteopenia and osteoporosis due to vitamin D deficiency.   ASSESSMENT AND PLAN:  Vitamin D deficiency - Plan: Vitamin D, Ergocalciferol, (DRISDOL) 1.25 MG (50000 UT) CAPS capsule  Insulin resistance - Plan: metFORMIN (GLUCOPHAGE) 500 MG tablet  At risk for osteoporosis  Class 2 severe obesity with serious comorbidity and body mass index (BMI) of 35.0 to 35.9 in adult, unspecified obesity type (HCC)  PLAN:  Vitamin D Deficiency Sierrah was informed that low vitamin D levels contributes to fatigue and are associated with obesity, breast, and colon cancer. She agrees to continue  to take prescription Vit D @50 ,000 IU every week # 4 with no refills and will follow up for routine testing of vitamin D, at least 2-3 times per year. She was informed of the risk of over-replacement of vitamin D and agrees to not increase her dose unless she discusses this with [place] first. Shante agrees to follow up with our clinic in 3 weeks.  Insulin Resistance Kealey will continue to work on weight loss, exercise, and decreasing simple carbohydrates in her diet to help decrease the risk of diabetes. We dicussed metformin including benefits and risks. She was informed that eating too many simple carbohydrates or too many calories at one sitting increases the likelihood of GI side effects. Lenea agreed to take metformin 500 mg q pm with dinner # 30 with no refills for now and prescription was written today. Raneem agrees to follow up with our clinic in 3 weeks.   At risk for osteopenia and osteoporosis Terrel was given extended  (15 minutes) osteoporosis prevention counseling today. Caden is at risk for osteopenia and osteoporosis due to her vitamin D deficiency. She was encouraged to take her vitamin D and follow her higher calcium diet and increase strengthening exercise to help strengthen her bones and decrease her risk of osteopenia and osteoporosis.  Obesity Brynnlie is currently in the action stage of change. As such, her goal is to continue with weight loss efforts She has agreed to follow the Category 2 plan + 100 calories Teena has been instructed to work up to a goal of 150 minutes of combined cardio and strengthening exercise per  week for weight loss and overall health benefits. We discussed the following Behavioral Modification Strategies today: increasing lean protein intake and work on meal planning and easy cooking plans  Ixel has agreed to follow up with our clinic in 3 weeks. She was informed of the importance of frequent follow up visits to maximize her  success with intensive lifestyle modifications for her multiple health conditions.  ALLERGIES: No Known Allergies  MEDICATIONS: Current Outpatient Medications on File Prior to Visit  Medication Sig Dispense Refill  . buPROPion (WELLBUTRIN SR) 100 MG 12 hr tablet Take 100 mg by mouth 2 (two) times daily.    . Chlorphen-PE-Acetaminophen (NOREL AD) 4-10-325 MG TABS Take 1 tablet by mouth. Take one tablet by mouth every 4-6 hours as needed    . divalproex (DEPAKOTE) 250 MG DR tablet Take 250 mg by mouth 3 (three) times daily as needed.    Marland Kitchen escitalopram (LEXAPRO) 20 MG tablet Take 20 mg by mouth daily.     Marland Kitchen estradiol (VIVELLE-DOT) 0.1 MG/24HR patch Place 1 patch onto the skin 2 (two) times a week. Apply one patch 2 x a week during menstration    . fluticasone (FLONASE) 50 MCG/ACT nasal spray Place 2 sprays into both nostrils daily. 16 g 6  . ibuprofen (ADVIL,MOTRIN) 600 MG tablet Take 600 mg by mouth every 6 (six) hours as needed.    . norgestimate-ethinyl estradiol (ORTHO-CYCLEN,SPRINTEC,PREVIFEM) 0.25-35 MG-MCG tablet Take 1 tablet by mouth daily.    . SUMAtriptan (IMITREX) 25 MG tablet Take 25 mg by mouth every 2 (two) hours as needed for migraine. May repeat in 2 hours if headache persists or recurs.     No current facility-administered medications on file prior to visit.     PAST MEDICAL HISTORY: Past Medical History:  Diagnosis Date  . Allergy    seasonal allergies  . Anxiety   . Arthritis    knees bilaterally  . Asthma    coincides with respiratory illness  . Back pain   . Constipation   . Depression   . Ectopic pregnancy without intrauterine pregnancy 2013  . GERD (gastroesophageal reflux disease)   . Headache(784.0)    migraines  . Joint pain   . Kidney stone 2009  . Lactose intolerance   . Leg edema   . Rheumatoid arthritis (HCC)   . SVD (spontaneous vaginal delivery) 02/25/2014  . Vitamin D deficiency     PAST SURGICAL HISTORY: Past Surgical History:  Procedure  Laterality Date  . DILATION AND CURETTAGE OF UTERUS  11/18/2012   Procedure: DILATATION AND CURETTAGE;  Surgeon: Serita Kyle, MD;  Location: [facility];  Service: Gynecology;  Laterality: N/A;  . falopian tube removed  December 2013   had a tubal pregnancy  . LAPAROSCOPY  11/18/2012   Procedure: LAPAROSCOPY OPERATIVE;  Surgeon: Serita Kyle, MD;  Location: [facility];  Service: Gynecology;  Laterality: N/A;  Operative Laparoscopy with removal of left fallopian tube and ectopic pregnancy, Dilation and evacuation    SOCIAL HISTORY: Social History   Tobacco Use  . Smoking status: Former Smoker    Types: Cigarettes  . Smokeless tobacco: Never Used  . Tobacco comment: 2014  Substance Use Topics  . Alcohol use: No  . Drug use: No    FAMILY HISTORY: Family History  Problem Relation Age of Onset  . Asthma Mother   . Obesity Mother   . Hypertension Mother   . Diabetes Father   . Hypertension Father   .  Asthma Brother   . Asthma Daughter   . Cancer Maternal Grandmother   . Diabetes Paternal Grandmother   . Hypertension Paternal Grandmother   . Diabetes Paternal Grandfather   . Hypertension Paternal Grandfather   . Hearing loss Paternal Grandfather        work related  . Other Neg Hx     ROS: Review of Systems  Constitutional: Negative for weight loss.  Gastrointestinal: Negative for nausea and vomiting.  Genitourinary: Negative for frequency.  Musculoskeletal:       Negative for muscle weakness  Endo/Heme/Allergies: Negative for polydipsia.    PHYSICAL EXAM: Blood pressure 124/84, pulse 97, temperature 98.1 F (36.7 C), temperature source Oral, height 5\' 3"  (1.6 m), weight 202 lb (91.6 kg), SpO2 100 %, unknown if currently breastfeeding. Body mass index is 35.78 kg/m. Physical Exam Vitals signs reviewed.  Constitutional:      Appearance: Normal appearance. She is obese.  Cardiovascular:     Rate and Rhythm: Normal rate.     Pulses: Normal pulses.    Pulmonary:     Effort: Pulmonary effort is normal.  Musculoskeletal: Normal range of motion.  Skin:    General: Skin is warm and dry.  Neurological:     Mental Status: She is alert and oriented to person, place, and time.  Psychiatric:        Mood and Affect: Mood normal.        Behavior: Behavior normal.     RECENT LABS AND TESTS: BMET    Component Value Date/Time   NA 141 09/10/2018 1001   K 4.3 09/10/2018 1001   CL 100 09/10/2018 1001   CO2 24 09/10/2018 1001   GLUCOSE 82 09/10/2018 1001   GLUCOSE 93 11/18/2012 0610   BUN 9 09/10/2018 1001   CREATININE 1.11 (H) 09/10/2018 1001   CALCIUM 9.5 09/10/2018 1001   GFRNONAA 66 09/10/2018 1001   GFRAA 76 09/10/2018 1001   Lab Results  Component Value Date   HGBA1C 5.6 09/10/2018   Lab Results  Component Value Date   INSULIN 23.3 09/10/2018   CBC    Component Value Date/Time   WBC 10.5 09/10/2018 1001   WBC 19.0 (H) 02/25/2014 0611   RBC 4.54 09/10/2018 1001   RBC 3.38 (L) 02/25/2014 0611   HGB 12.4 09/10/2018 1001   HCT 38.3 09/10/2018 1001   PLT 155 02/25/2014 0611   MCV 84 09/10/2018 1001   MCH 27.3 09/10/2018 1001   MCH 29.3 02/25/2014 0611   MCHC 32.4 09/10/2018 1001   MCHC 34.5 02/25/2014 0611   RDW 14.3 09/10/2018 1001   LYMPHSABS 1.8 09/10/2018 1001   MONOABS 1.1 (H) 11/18/2012 0610   EOSABS 0.3 09/10/2018 1001   BASOSABS 0.0 09/10/2018 1001   Iron/TIBC/Ferritin/ %Sat No results found for: IRON, TIBC, FERRITIN, IRONPCTSAT Lipid Panel     Component Value Date/Time   CHOL 207 (H) 09/10/2018 1001   TRIG 121 09/10/2018 1001   HDL 75 09/10/2018 1001   LDLCALC 108 (H) 09/10/2018 1001   Hepatic Function Panel     Component Value Date/Time   PROT 7.2 09/10/2018 1001   ALBUMIN 4.0 09/10/2018 1001   AST 13 09/10/2018 1001   ALT 9 09/10/2018 1001   ALKPHOS 59 09/10/2018 1001   BILITOT <0.2 09/10/2018 1001      Component Value Date/Time   TSH 5.110 (H) 09/10/2018 1001     Ref. Range 09/10/2018  10:01  Vitamin D, 25-Hydroxy Latest Ref Range: 30.0 -  100.0 ng/mL 24.6 (L)     OBESITY BEHAVIORAL INTERVENTION VISIT  Today's visit was # 5   Starting weight: 209 lbs Starting date: 09/10/2018 Today's weight :: 202 lb Today's date: 12/25/2018 Total lbs lost to date: 7  ASK: We discussed the diagnosis of obesity with Glendine D Rommel today and Kristyana agreed to give us permission to discuss obesity behavioral modification therapy today.  ASSESS: Tru has the diagnosis of obesity and her BMI today is 35.79 Brandan is in the action stage of change   ADVISE: Amandy was educated on the multiple health risks of obesity as well as the benefit of weight loss to improve her health. She was advised of the need for long term treatment and the importance of lifestyle modifications to improve her current health and to decrease her risk of future health problems.  AGREE: Multiple dietary modification options and treatment options were discussed and  Alysha agreed to follow the recommendations documented in the above note.  ARRANGE: Kiyana was educated on the importance of frequent visits to treat obesity as outlined per CMS and USPSTF guidelines and agreed to schedule her next follow up appointment today.  I, Tammy Wysor, am acting as Energy managertranscriptionist for [employer] Blue Winther PA-C I, Alois Clicheracey Gervis Gaba, PA-C have reviewed above note and agree with its content  v

## 2019-01-09 ENCOUNTER — Encounter (INDEPENDENT_AMBULATORY_CARE_PROVIDER_SITE_OTHER): Payer: Self-pay | Admitting: Physician Assistant

## 2019-01-17 ENCOUNTER — Ambulatory Visit (INDEPENDENT_AMBULATORY_CARE_PROVIDER_SITE_OTHER): Payer: BC Managed Care – PPO | Admitting: Physician Assistant

## 2019-01-17 ENCOUNTER — Other Ambulatory Visit (INDEPENDENT_AMBULATORY_CARE_PROVIDER_SITE_OTHER): Payer: Self-pay | Admitting: Physician Assistant

## 2019-01-17 ENCOUNTER — Encounter (INDEPENDENT_AMBULATORY_CARE_PROVIDER_SITE_OTHER): Payer: Self-pay | Admitting: Physician Assistant

## 2019-01-17 VITALS — BP 132/85 | HR 97 | Temp 98.0°F | Ht 63.0 in | Wt 205.0 lb

## 2019-01-17 DIAGNOSIS — E559 Vitamin D deficiency, unspecified: Secondary | ICD-10-CM | POA: Diagnosis not present

## 2019-01-17 DIAGNOSIS — Z9189 Other specified personal risk factors, not elsewhere classified: Secondary | ICD-10-CM

## 2019-01-17 DIAGNOSIS — Z6836 Body mass index (BMI) 36.0-36.9, adult: Secondary | ICD-10-CM

## 2019-01-17 DIAGNOSIS — R5383 Other fatigue: Secondary | ICD-10-CM | POA: Diagnosis not present

## 2019-01-17 DIAGNOSIS — E8881 Metabolic syndrome: Secondary | ICD-10-CM

## 2019-01-17 DIAGNOSIS — E7849 Other hyperlipidemia: Secondary | ICD-10-CM

## 2019-01-17 MED ORDER — VITAMIN D (ERGOCALCIFEROL) 1.25 MG (50000 UNIT) PO CAPS: 50000.0000 [IU] | ORAL_CAPSULE | ORAL | 0 refills | Status: DC

## 2019-01-17 NOTE — Progress Notes (Signed)
Office: [phone]  /  Fax: [phone]   HPI:   Chief Complaint: OBESITY Diane [name] is here to discuss her progress with her obesity treatment plan. She is on the Category 2 plan + 100 calories and is following her eating plan approximately 85% of the time. She states she is exercising 0 minutes 0 times per week. Diane [name] reports that she is having a hard time following the structure of the plan. She is not getting all of her protein in and skipping meals at times due to a busy schedule.  Her weight is 205 lb (93 kg) today and has had a weight gain of 3 pounds since her last visit. She has lost 4 lbs since starting treatment with [place].  Vitamin D deficiency Diane [name] has a diagnosis of Vitamin D deficiency. She is currently taking prescription Vit D and denies nausea, vomiting or muscle weakness.  Hyperlipidemia Diane [name] has hyperlipidemia and has been trying to improve her cholesterol levels with intensive lifestyle modification including a low saturated fat diet, exercise and weight loss. Diane [name] is not on any medication and denies any chest pain.  Excessive Fatigue Diane [name] feels her energy is lower than it should be. Diane [name] has a previous history of an abnormal thyroid panel without medication.  Insulin Resistance Diane [name] has a diagnosis of insulin resistance based on her elevated fasting insulin level >5. Although Diane [name]'s blood glucose readings are still under good control, insulin resistance puts her at greater risk of metabolic syndrome and diabetes. She is taking metformin currently and continues to work on diet and exercise to decrease risk of diabetes. No nausea, vomiting, or diarrhea. No polyphagia.  At risk for cardiovascular disease Diane [name] is at a higher than average risk for cardiovascular disease due to obesity. She currently denies any chest pain.  ASSESSMENT AND PLAN:  Vitamin D deficiency - Plan: VITAMIN D 25 Hydroxy (Vit-D Deficiency, Fractures),  Vitamin D, Ergocalciferol, (DRISDOL) 1.25 MG (50000 UT) CAPS capsule  Other hyperlipidemia - Plan: Lipid Panel With LDL/HDL Ratio  Insulin resistance - Plan: Comprehensive metabolic panel, Hemoglobin A1c  Other fatigue - excessive - Plan: T3, T4, free, TSH, CBC With Differential  At risk for heart disease  Class 2 severe obesity with serious comorbidity and body mass index (BMI) of 36.0 to 36.9 in adult, unspecified obesity type (HCC)  PLAN:  Vitamin D Deficiency Diane [name] was informed that low Vitamin D levels contributes to fatigue and are associated with obesity, breast, and colon cancer. She agrees to continue to take prescription Vit D @ 50,000 IU every week #4 with no refills and will follow-up for routine testing of Vitamin D, at least 2-3 times per year. She was informed of the risk of over-replacement of Vitamin D and agrees to not increase her dose unless she discusses this with [place] first. Diane [name] agrees to follow-up with our clinic in 2-3 weeks.  Hyperlipidemia Diane [name] was informed of the American Heart Association Guidelines emphasizing intensive lifestyle modifications as the first line treatment for hyperlipidemia. We discussed many lifestyle modifications today in depth, and Diane [name] will continue to work on decreasing saturated fats such as fatty red meat, butter and many fried foods. She will also increase vegetables and lean protein in her diet and continue to work on exercise and weight loss efforts.  Fatigue Diane [name] was informed that her fatigue may be related to obesity, depression or many other causes. Labs will be ordered, and in the meanwhile Diane [name] has agreed to work on diet, exercise and weight loss  to help with fatigue. Proper sleep hygiene was discussed including the need for 7-8 hours of quality sleep each night. We will check thyroid panel.  Insulin Resistance Diane [name] will continue to work on weight loss, exercise, and decreasing simple carbohydrates  in her diet to help decrease the risk of diabetes. We dicussed metformin including benefits and risks. She was informed that eating too many simple carbohydrates or too many calories at one sitting increases the likelihood of GI side effects. Diane [name] was advised to continue metformin for now and prescription was not written today. Diane [name] agrees to follow-up with our clinic in 2-3 weeks.  Cardiovascular risk counseling Diane [name] was given extended (15 minutes) coronary artery disease prevention counseling today. She is 33 y.o. female and has risk factors for heart disease including obesity. We discussed intensive lifestyle modifications today with an emphasis on specific weight loss instructions and strategies. Pt was also informed of the importance of increasing exercise and decreasing saturated fats to help prevent heart disease.  Obesity Diane [name] is currently in the action stage of change. As such, her goal is to continue with weight loss efforts. We will change her diet to 1200-1300 calories + 85 grams of protein daily. Diane [name] has been instructed to work up to a goal of 150 minutes of combined cardio and strengthening exercise per week for weight loss and overall health benefits. We discussed the following Behavioral Modification Start.gies today: increasing lean protein intake and work on meal planning and easy cooking plans, and keeping healthy foods in the home.  Diane [name] has agreed to follow up with our clinic in 2-3 weeks. She was informed of the importance of frequent follow up visits to maximize her success with intensive lifestyle modifications for her multiple health conditions.  ALLERGIES: No Known Allergies  MEDICATIONS: Current Outpatient Medications on File Prior to Visit  Medication Sig Dispense Refill  . buPROPion (WELLBUTRIN SR) 100 MG 12 hr tablet Take 100 mg by mouth 2 (two) times daily.    . Chlorphen-PE-Acetaminophen (NOREL AD) 4-10-325 MG TABS Take 1 tablet by  mouth. Take one tablet by mouth every 4-6 hours as needed    . divalproex (DEPAKOTE) 250 MG DR tablet Take 250 mg by mouth 3 (three) times daily as needed.    Marland Kitchen. escitalopram (LEXAPRO) 20 MG tablet Take 20 mg by mouth daily.     Marland Kitchen. estradiol (VIVELLE-DOT) 0.1 MG/24HR patch Place 1 patch onto the skin 2 (two) times a week. Apply one patch 2 x a week during menstration    . fluticasone (FLONASE) 50 MCG/ACT nasal spray Place 2 sprays into both nostrils daily. 16 g 6  . ibuprofen (ADVIL,MOTRIN) 600 MG tablet Take 600 mg by mouth every 6 (six) hours as needed.    . metFORMIN (GLUCOPHAGE) 500 MG tablet Take 1 tablet (500 mg total) by mouth at bedtime. 30 tablet 0  . norgestimate-ethinyl estradiol (ORTHO-CYCLEN,SPRINTEC,PREVIFEM) 0.25-35 MG-MCG tablet Take 1 tablet by mouth daily.    . SUMAtriptan (IMITREX) 25 MG tablet Take 25 mg by mouth every 2 (two) hours as needed for migraine. May repeat in 2 hours if headache persists or recurs.     No current facility-administered medications on file prior to visit.     PAST MEDICAL HISTORY: Past Medical History:  Diagnosis Date  . Allergy    seasonal allergies  . Anxiety   . Arthritis    knees bilaterally  . Asthma    coincides with respiratory illness  . Back pain   .  Constipation   . Depression   . Ectopic pregnancy without intrauterine pregnancy 2013  . GERD (gastroesophageal reflux disease)   . Headache(784.0)    migraines  . Joint pain   . Kidney stone 2009  . Lactose intolerance   . Leg edema   . Rheumatoid arthritis (HCC)   . SVD (spontaneous vaginal delivery) 02/25/2014  . Vitamin D deficiency     PAST SURGICAL HISTORY: Past Surgical History:  Procedure Laterality Date  . DILATION AND CURETTAGE OF UTERUS  11/18/2012   Procedure: DILATATION AND CURETTAGE;  Surgeon: Serita Kyle, MD;  Location: [facility];  Service: Gynecology;  Laterality: N/A;  . falopian tube removed  December 2013   had a tubal pregnancy  . LAPAROSCOPY   11/18/2012   Procedure: LAPAROSCOPY OPERATIVE;  Surgeon: Serita Kyle, MD;  Location: [facility];  Service: Gynecology;  Laterality: N/A;  Operative Laparoscopy with removal of left fallopian tube and ectopic pregnancy, Dilation and evacuation    SOCIAL HISTORY: Social History   Tobacco Use  . Smoking status: Former Smoker    Types: Cigarettes  . Smokeless tobacco: Never Used  . Tobacco comment: 2014  Substance Use Topics  . Alcohol use: No  . Drug use: No    FAMILY HISTORY: Family History  Problem Relation Age of Onset  . Asthma Mother   . Obesity Mother   . Hypertension Mother   . Diabetes Father   . Hypertension Father   . Asthma Brother   . Asthma Daughter   . Cancer Maternal Grandmother   . Diabetes Paternal Grandmother   . Hypertension Paternal Grandmother   . Diabetes Paternal Grandfather   . Hypertension Paternal Grandfather   . Hearing loss Paternal Grandfather        work related  . Other Neg Hx    ROS: Review of Systems  Constitutional: Negative for weight loss.  Cardiovascular: Negative for chest pain.  Gastrointestinal: Negative for diarrhea, nausea and vomiting.  Musculoskeletal:       Negative muscle weakness   Neurological:       Positive fatigue  Endo/Heme/Allergies:       Negative polyphagia Negative hypoglycemia   PHYSICAL EXAM: Blood pressure 132/85, pulse 97, temperature 98 F (36.7 C), temperature source Oral, height 5\' 3"  (1.6 m), weight 205 lb (93 kg), SpO2 98 %, unknown if currently breastfeeding. Body mass index is 36.31 kg/m. Physical Exam Vitals signs reviewed.  Constitutional:      Appearance: Normal appearance. She is obese.  Cardiovascular:     Rate and Rhythm: Normal rate.     Pulses: Normal pulses.  Pulmonary:     Effort: Pulmonary effort is normal.     Breath sounds: Normal breath sounds.  Musculoskeletal: Normal range of motion.  Skin:    General: Skin is dry.  Neurological:     Mental Status: She is alert and  oriented to person, place, and time.  Psychiatric:        Mood and Affect: Mood normal.        Behavior: Behavior normal.   RECENT LABS AND TESTS: BMET    Component Value Date/Time   NA 141 09/10/2018 1001   K 4.3 09/10/2018 1001   CL 100 09/10/2018 1001   CO2 24 09/10/2018 1001   GLUCOSE 82 09/10/2018 1001   GLUCOSE 93 11/18/2012 0610   BUN 9 09/10/2018 1001   CREATININE 1.11 (H) 09/10/2018 1001   CALCIUM 9.5 09/10/2018 1001   GFRNONAA 66 09/10/2018  1001   GFRAA 76 09/10/2018 1001   Lab Results  Component Value Date   HGBA1C 5.6 09/10/2018   Lab Results  Component Value Date   INSULIN 23.3 09/10/2018   CBC    Component Value Date/Time   WBC 10.5 09/10/2018 1001   WBC 19.0 (H) 02/25/2014 0611   RBC 4.54 09/10/2018 1001   RBC 3.38 (L) 02/25/2014 0611   HGB 12.4 09/10/2018 1001   HCT 38.3 09/10/2018 1001   PLT 155 02/25/2014 0611   MCV 84 09/10/2018 1001   MCH 27.3 09/10/2018 1001   MCH 29.3 02/25/2014 0611   MCHC 32.4 09/10/2018 1001   MCHC 34.5 02/25/2014 0611   RDW 14.3 09/10/2018 1001   LYMPHSABS 1.8 09/10/2018 1001   MONOABS 1.1 (H) 11/18/2012 0610   EOSABS 0.3 09/10/2018 1001   BASOSABS 0.0 09/10/2018 1001   Iron/TIBC/Ferritin/ %Sat No results found for: IRON, TIBC, FERRITIN, IRONPCTSAT Lipid Panel     Component Value Date/Time   CHOL 207 (H) 09/10/2018 1001   TRIG 121 09/10/2018 1001   HDL 75 09/10/2018 1001   LDLCALC 108 (H) 09/10/2018 1001   Hepatic Function Panel     Component Value Date/Time   PROT 7.2 09/10/2018 1001   ALBUMIN 4.0 09/10/2018 1001   AST 13 09/10/2018 1001   ALT 9 09/10/2018 1001   ALKPHOS 59 09/10/2018 1001   BILITOT <0.2 09/10/2018 1001      Component Value Date/Time   TSH 5.110 (H) 09/10/2018 1001   Results for LIBRADA, CASTRONOVO D (MRN [id number]) as of 01/17/2019 10:04  Ref. Range 09/10/2018 10:01  Vitamin D, 25-Hydroxy Latest Ref Range: 30.0 - 100.0 ng/mL 24.6 (L)   OBESITY BEHAVIORAL INTERVENTION VISIT  Today's  visit was #6  Starting weight: 209 lbs Starting date: 09/10/2018 Today's weight: 205 lbs Today's date: 01/17/2019 Total lbs lost to date: 4  ASK: We discussed the diagnosis of obesity with Diane [name] today and Diane [name] agreed to give [place] permission to discuss obesity behavioral modification therapy today.  ASSESS: Diane [name] has the diagnosis of obesity and her BMI today is @ 36.31. Diane [name] is in the action stage of change.  ADVISE: Diane [name] was educated on the multiple health risks of obesity as well as the benefit of weight loss to improve her health. She was advised of the need for long term treatment and the importance of lifestyle modifications to improve her current health and to decrease her risk of future health problems.  AGREE: Multiple dietary modification options and treatment options were discussed and  Merrianne agreed to follow the recommendations documented in the above note.  ARRANGE: Myda was educated on the importance of frequent visits to treat obesity as outlined per CMS and USPSTF guidelines and agreed to schedule her next follow up appointment today.  Fernanda Drum, am acting as transcriptionist for Alois Cliche, PA-C I, Alois Cliche, PA-C have reviewed above note and agree with its content

## 2019-01-18 LAB — LIPID PANEL WITH LDL/HDL RATIO
Cholesterol, Total: 199 mg/dL (ref 100–199)
HDL: 76 mg/dL (ref >39)
LDL Calculated: 101 mg/dL — ABNORMAL HIGH (ref 0–99)
LDL/HDL RATIO: 1.3 ratio (ref 0.0–3.2)
Triglycerides: 108 mg/dL (ref 0–149)
VLDL Cholesterol Cal: 22 mg/dL (ref 5–40)

## 2019-01-18 LAB — COMPREHENSIVE METABOLIC PANEL
ALT: 9 IU/L (ref 0–32)
AST: 8 IU/L (ref 0–40)
Albumin/Globulin Ratio: 1.4 (ref 1.2–2.2)
Albumin: 4 g/dL (ref 3.8–4.8)
Alkaline Phosphatase: 54 IU/L (ref 39–117)
BUN / CREAT RATIO: 8 — AB (ref 9–23)
BUN: 8 mg/dL (ref 6–20)
Bilirubin Total: 0.2 mg/dL (ref 0.0–1.2)
CO2: 21 mmol/L (ref 20–29)
Calcium: 8.8 mg/dL (ref 8.7–10.2)
Chloride: 103 mmol/L (ref 96–106)
Creatinine, Ser: 1.03 mg/dL — ABNORMAL HIGH (ref 0.57–1.00)
GFR calc Af Amer: 83 mL/min/{1.73_m2} (ref >59)
GFR, EST NON AFRICAN AMERICAN: 72 mL/min/{1.73_m2} (ref >59)
Globulin, Total: 2.8 g/dL (ref 1.5–4.5)
Glucose: 71 mg/dL (ref 65–99)
Potassium: 4.5 mmol/L (ref 3.5–5.2)
Sodium: 140 mmol/L (ref 134–144)
TOTAL PROTEIN: 6.8 g/dL (ref 6.0–8.5)

## 2019-01-18 LAB — CBC WITH DIFFERENTIAL
Basophils Absolute: 0 10*3/uL (ref 0.0–0.2)
Basos: 0 %
EOS (ABSOLUTE): 0.2 10*3/uL (ref 0.0–0.4)
Eos: 2 %
Hematocrit: 37.8 % (ref 34.0–46.6)
Hemoglobin: 12.3 g/dL (ref 11.1–15.9)
IMMATURE GRANS (ABS): 0 10*3/uL (ref 0.0–0.1)
Immature Granulocytes: 0 %
LYMPHS: 23 %
Lymphocytes Absolute: 2.1 10*3/uL (ref 0.7–3.1)
MCH: 27 pg (ref 26.6–33.0)
MCHC: 32.5 g/dL (ref 31.5–35.7)
MCV: 83 fL (ref 79–97)
MONOCYTES: 8 %
Monocytes Absolute: 0.7 10*3/uL (ref 0.1–0.9)
Neutrophils Absolute: 6.1 10*3/uL (ref 1.4–7.0)
Neutrophils: 67 %
RBC: 4.55 x10E6/uL (ref 3.77–5.28)
RDW: 13.3 % (ref 11.7–15.4)
WBC: 9.2 10*3/uL (ref 3.4–10.8)

## 2019-01-18 LAB — HEMOGLOBIN A1C
Est. average glucose Bld gHb Est-mCnc: 103 mg/dL
Hgb A1c MFr Bld: 5.2 % (ref 4.8–5.6)

## 2019-01-18 LAB — T3: T3, Total: 174 ng/dL (ref 71–180)

## 2019-01-18 LAB — TSH: TSH: 5.58 u[IU]/mL — ABNORMAL HIGH (ref 0.450–4.500)

## 2019-01-18 LAB — INSULIN, RANDOM: INSULIN: 15.5 u[IU]/mL (ref 2.6–24.9)

## 2019-01-18 LAB — VITAMIN D 25 HYDROXY (VIT D DEFICIENCY, FRACTURES): Vit D, 25-Hydroxy: 37 ng/mL (ref 30.0–100.0)

## 2019-01-18 LAB — T4, FREE: Free T4: 1.24 ng/dL (ref 0.82–1.77)

## 2019-02-07 ENCOUNTER — Ambulatory Visit (INDEPENDENT_AMBULATORY_CARE_PROVIDER_SITE_OTHER): Payer: BC Managed Care – PPO | Admitting: Physician Assistant

## 2019-02-14 ENCOUNTER — Encounter (INDEPENDENT_AMBULATORY_CARE_PROVIDER_SITE_OTHER): Payer: Self-pay | Admitting: Physician Assistant

## 2019-02-20 ENCOUNTER — Encounter (INDEPENDENT_AMBULATORY_CARE_PROVIDER_SITE_OTHER): Payer: Self-pay | Admitting: Physician Assistant

## 2019-02-20 ENCOUNTER — Ambulatory Visit (INDEPENDENT_AMBULATORY_CARE_PROVIDER_SITE_OTHER): Payer: BC Managed Care – PPO | Admitting: Physician Assistant

## 2019-02-20 ENCOUNTER — Encounter (INDEPENDENT_AMBULATORY_CARE_PROVIDER_SITE_OTHER): Payer: Self-pay

## 2019-03-06 ENCOUNTER — Encounter (INDEPENDENT_AMBULATORY_CARE_PROVIDER_SITE_OTHER): Payer: Self-pay

## 2019-08-28 ENCOUNTER — Other Ambulatory Visit: Payer: Self-pay

## 2019-08-28 DIAGNOSIS — Z20822 Contact with and (suspected) exposure to covid-19: Secondary | ICD-10-CM

## 2019-08-29 LAB — NOVEL CORONAVIRUS, NAA: SARS-CoV-2, NAA: NOT DETECTED

## 2019-10-29 ENCOUNTER — Other Ambulatory Visit: Payer: Self-pay

## 2019-10-29 DIAGNOSIS — Z20822 Contact with and (suspected) exposure to covid-19: Secondary | ICD-10-CM

## 2019-10-31 LAB — NOVEL CORONAVIRUS, NAA: SARS-CoV-2, NAA: NOT DETECTED

## 2019-12-22 ENCOUNTER — Emergency Department (HOSPITAL_BASED_OUTPATIENT_CLINIC_OR_DEPARTMENT_OTHER): Payer: BC Managed Care – PPO

## 2019-12-22 ENCOUNTER — Emergency Department (HOSPITAL_BASED_OUTPATIENT_CLINIC_OR_DEPARTMENT_OTHER)
Admission: EM | Admit: 2019-12-22 | Discharge: 2019-12-22 | Disposition: A | Discharge status: Home / Self Care | Payer: BC Managed Care – PPO | Attending: Emergency Medicine | Admitting: Emergency Medicine

## 2019-12-22 ENCOUNTER — Encounter (HOSPITAL_BASED_OUTPATIENT_CLINIC_OR_DEPARTMENT_OTHER): Payer: Self-pay

## 2019-12-22 ENCOUNTER — Other Ambulatory Visit: Payer: Self-pay

## 2019-12-22 DIAGNOSIS — Z3202 Encounter for pregnancy test, result negative: Secondary | ICD-10-CM | POA: Insufficient documentation

## 2019-12-22 DIAGNOSIS — M25551 Pain in right hip: Secondary | ICD-10-CM | POA: Insufficient documentation

## 2019-12-22 DIAGNOSIS — F17210 Nicotine dependence, cigarettes, uncomplicated: Secondary | ICD-10-CM | POA: Diagnosis not present

## 2019-12-22 DIAGNOSIS — Y9389 Activity, other specified: Secondary | ICD-10-CM | POA: Insufficient documentation

## 2019-12-22 DIAGNOSIS — S8991XA Unspecified injury of right lower leg, initial encounter: Secondary | ICD-10-CM | POA: Insufficient documentation

## 2019-12-22 DIAGNOSIS — J45909 Unspecified asthma, uncomplicated: Secondary | ICD-10-CM | POA: Insufficient documentation

## 2019-12-22 DIAGNOSIS — Z79899 Other long term (current) drug therapy: Secondary | ICD-10-CM | POA: Insufficient documentation

## 2019-12-22 DIAGNOSIS — Z7984 Long term (current) use of oral hypoglycemic drugs: Secondary | ICD-10-CM | POA: Insufficient documentation

## 2019-12-22 DIAGNOSIS — Y999 Unspecified external cause status: Secondary | ICD-10-CM | POA: Diagnosis not present

## 2019-12-22 DIAGNOSIS — M25571 Pain in right ankle and joints of right foot: Secondary | ICD-10-CM | POA: Insufficient documentation

## 2019-12-22 DIAGNOSIS — Y92410 Unspecified street and highway as the place of occurrence of the external cause: Secondary | ICD-10-CM | POA: Insufficient documentation

## 2019-12-22 LAB — PREGNANCY, URINE: Preg Test, Ur: NEGATIVE

## 2019-12-22 MED ORDER — KETOROLAC TROMETHAMINE 60 MG/2ML IM SOLN
30.0000 mg | Freq: Once | INTRAMUSCULAR | Status: AC
  Administered 2019-12-22: 30 mg via INTRAMUSCULAR
  Filled 2019-12-22: qty 2
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
## 2019-12-22 NOTE — ED Notes (Signed)
Patient transported to X-ray 

## 2019-12-22 NOTE — ED Notes (Signed)
Pt ambulated to d/c window with steady gait 
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
## 2020-02-15 ENCOUNTER — Ambulatory Visit: Payer: BC Managed Care – PPO | Attending: Internal Medicine

## 2020-02-15 DIAGNOSIS — Z23 Encounter for immunization: Secondary | ICD-10-CM

## 2020-02-15 NOTE — Progress Notes (Signed)
   Covid-19 Vaccination Clinic  Name:  ALIHA DIEDRICH    MRN: [id number] DOB: 10/07/1986  02/15/2020  Ms. Jafri was observed post Covid-19 immunization for 15 minutes without incident. She was provided with Vaccine Information Sheet and instruction to access the V-Safe system.   Ms. Scharlene Gloss was instructed to call 911 with any severe reactions post vaccine: Marland Kitchen Difficulty breathing  . Swelling of face and throat  . A fast heartbeat  . A bad rash all over body  . Dizziness and weakness   Immunizations Administered    Name Date Dose VIS Date Route   Pfizer COVID-19 Vaccine 02/15/2020  8:31 AM 0.3 mL 11/22/2019 Intramuscular   Manufacturer: [employer], [employer]   Lot: [id number]   NDC: [address]-8

## 2020-03-07 ENCOUNTER — Ambulatory Visit: Payer: BC Managed Care – PPO | Attending: Internal Medicine

## 2020-03-07 DIAGNOSIS — Z23 Encounter for immunization: Secondary | ICD-10-CM

## 2020-03-07 NOTE — Progress Notes (Signed)
   Covid-19 Vaccination Clinic  Name:  KEYASIA JOLLIFF    MRN: [id number] DOB: 1986-08-26  03/07/2020  Ms. O'neal was observed post Covid-19 immunization for 15 minutes without incident. She was provided with Vaccine Information Sheet and instruction to access the V-Safe system.   Ms. kreiter was instructed to call 911 with any severe reactions post vaccine: Marland Kitchen Difficulty breathing  . Swelling of face and throat  . A fast heartbeat  . A bad rash all over body  . Dizziness and weakness   Immunizations Administered    Name Date Dose VIS Date Route   Pfizer COVID-19 Vaccine 03/07/2020  8:14 AM 0.3 mL 11/22/2019 Intramuscular   Manufacturer: [employer], [employer]   Lot: [id number]   NDC: [address]-6

## 2020-10-29 ENCOUNTER — Other Ambulatory Visit: Payer: Self-pay | Admitting: Obstetrics and Gynecology

## 2020-11-09 ENCOUNTER — Encounter (HOSPITAL_BASED_OUTPATIENT_CLINIC_OR_DEPARTMENT_OTHER): Payer: Self-pay | Admitting: Obstetrics and Gynecology

## 2020-11-10 ENCOUNTER — Other Ambulatory Visit: Payer: Self-pay

## 2020-11-10 ENCOUNTER — Encounter (HOSPITAL_BASED_OUTPATIENT_CLINIC_OR_DEPARTMENT_OTHER): Payer: Self-pay | Admitting: Obstetrics and Gynecology

## 2020-11-10 NOTE — Progress Notes (Addendum)
Spoke w/ via phone for pre-op interview---pt Lab needs dos----     I stat 8, ekg, cbc, serum preg          Lab results------none COVID test ------11-13-2020 at 815 am Arrive at -------1200 pm 11-16-2020 NPO after MN NO Solid Food.  Clear liquids from MN until---1100 am then npo Medications to take morning of surgery -----none Diabetic medication -----n/a Patient Special Instructions -----none Pre-Op special Istructions -----none Patient verbalized understanding of instructions that were given at this phone interview. Patient denies shortness of breath, chest pain, fever, cough at this phone interview.  Addendum: pt has left ear cartlidge piercing will try to remove

## 2020-11-13 ENCOUNTER — Other Ambulatory Visit (HOSPITAL_COMMUNITY)
Admission: RE | Admit: 2020-11-13 | Discharge: 2020-11-13 | Disposition: A | Discharge status: Home / Self Care | Payer: 59 | Source: Ambulatory Visit | Attending: Obstetrics and Gynecology | Admitting: Obstetrics and Gynecology

## 2020-11-13 DIAGNOSIS — Z20822 Contact with and (suspected) exposure to covid-19: Secondary | ICD-10-CM | POA: Diagnosis not present

## 2020-11-13 DIAGNOSIS — Z01812 Encounter for preprocedural laboratory examination: Secondary | ICD-10-CM | POA: Insufficient documentation

## 2020-11-13 LAB — SARS CORONAVIRUS 2 (TAT 6-24 HRS): SARS Coronavirus 2: NEGATIVE

## 2020-11-16 ENCOUNTER — Ambulatory Visit (HOSPITAL_BASED_OUTPATIENT_CLINIC_OR_DEPARTMENT_OTHER): Payer: 59 | Admitting: Anesthesiology

## 2020-11-16 ENCOUNTER — Ambulatory Visit (HOSPITAL_BASED_OUTPATIENT_CLINIC_OR_DEPARTMENT_OTHER)
Admission: RE | Admit: 2020-11-16 | Discharge: 2020-11-16 | Disposition: A | Discharge status: Home / Self Care | Payer: 59 | Attending: Obstetrics and Gynecology | Admitting: Obstetrics and Gynecology

## 2020-11-16 ENCOUNTER — Encounter (HOSPITAL_BASED_OUTPATIENT_CLINIC_OR_DEPARTMENT_OTHER): Payer: Self-pay | Admitting: Obstetrics and Gynecology

## 2020-11-16 ENCOUNTER — Encounter (HOSPITAL_BASED_OUTPATIENT_CLINIC_OR_DEPARTMENT_OTHER)
Admission: RE | Disposition: A | Discharge status: Home / Self Care | Payer: Self-pay | Source: Home / Self Care | Attending: Obstetrics and Gynecology

## 2020-11-16 DIAGNOSIS — Z793 Long term (current) use of hormonal contraceptives: Secondary | ICD-10-CM | POA: Diagnosis not present

## 2020-11-16 DIAGNOSIS — Z9079 Acquired absence of other genital organ(s): Secondary | ICD-10-CM | POA: Diagnosis not present

## 2020-11-16 DIAGNOSIS — Z302 Encounter for sterilization: Secondary | ICD-10-CM | POA: Diagnosis present

## 2020-11-16 DIAGNOSIS — Z79899 Other long term (current) drug therapy: Secondary | ICD-10-CM | POA: Insufficient documentation

## 2020-11-16 DIAGNOSIS — F17210 Nicotine dependence, cigarettes, uncomplicated: Secondary | ICD-10-CM | POA: Insufficient documentation

## 2020-11-16 HISTORY — DX: Personal history of urinary calculi: Z87.442

## 2020-11-16 HISTORY — DX: Family history of other specified conditions: Z84.89

## 2020-11-16 HISTORY — DX: Essential (primary) hypertension: I10

## 2020-11-16 HISTORY — PX: LAPAROSCOPIC TUBAL LIGATION: SHX1937

## 2020-11-16 LAB — CBC
HCT: 38.2 % (ref 36.0–46.0)
Hemoglobin: 13 g/dL (ref 12.0–15.0)
MCH: 28.2 pg (ref 26.0–34.0)
MCHC: 34 g/dL (ref 30.0–36.0)
MCV: 82.9 fL (ref 80.0–100.0)
Platelets: 327 10*3/uL (ref 150–400)
RBC: 4.61 MIL/uL (ref 3.87–5.11)
RDW: 13.6 % (ref 11.5–15.5)
WBC: 11.8 10*3/uL — ABNORMAL HIGH (ref 4.0–10.5)
nRBC: 0 % (ref 0.0–0.2)

## 2020-11-16 LAB — POCT I-STAT, CHEM 8
BUN: 6 mg/dL (ref 6–20)
Calcium, Ion: 1.23 mmol/L (ref 1.15–1.40)
Chloride: 100 mmol/L (ref 98–111)
Creatinine, Ser: 0.8 mg/dL (ref 0.44–1.00)
Glucose, Bld: 88 mg/dL (ref 70–99)
HCT: 41 % (ref 36.0–46.0)
Hemoglobin: 13.9 g/dL (ref 12.0–15.0)
Potassium: 3.4 mmol/L — ABNORMAL LOW (ref 3.5–5.1)
Sodium: 138 mmol/L (ref 135–145)
TCO2: 25 mmol/L (ref 22–32)

## 2020-11-16 LAB — HCG, SERUM, QUALITATIVE: Preg, Serum: NEGATIVE

## 2020-11-16 SURGERY — LIGATION, FALLOPIAN TUBE, LAPAROSCOPIC
Anesthesia: General | Site: Abdomen | Laterality: Right

## 2020-11-16 MED ORDER — SUGAMMADEX SODIUM 200 MG/2ML IV SOLN
INTRAVENOUS | Status: DC | PRN
  Administered 2020-11-16: 400 mg via INTRAVENOUS

## 2020-11-16 MED ORDER — ROCURONIUM BROMIDE 10 MG/ML (PF) SYRINGE
PREFILLED_SYRINGE | INTRAVENOUS | Status: AC
  Filled 2020-11-16: qty 10

## 2020-11-16 MED ORDER — POVIDONE-IODINE 10 % EX SWAB: 2.0000 "application " | Freq: Once | CUTANEOUS | Status: DC

## 2020-11-16 MED ORDER — PROPOFOL 10 MG/ML IV BOLUS
INTRAVENOUS | Status: AC
  Filled 2020-11-16: qty 20

## 2020-11-16 MED ORDER — IBUPROFEN 800 MG PO TABS: 800.0000 mg | ORAL_TABLET | Freq: Three times a day (TID) | ORAL | 0 refills | Status: DC | PRN

## 2020-11-16 MED ORDER — LIDOCAINE HCL (CARDIAC) PF 100 MG/5ML IV SOSY
PREFILLED_SYRINGE | INTRAVENOUS | Status: DC | PRN
  Administered 2020-11-16: 100 mg via INTRAVENOUS

## 2020-11-16 MED ORDER — KETOROLAC TROMETHAMINE 30 MG/ML IJ SOLN
INTRAMUSCULAR | Status: AC
  Filled 2020-11-16: qty 1

## 2020-11-16 MED ORDER — FENTANYL CITRATE (PF) 100 MCG/2ML IJ SOLN
INTRAMUSCULAR | Status: AC
  Filled 2020-11-16: qty 2

## 2020-11-16 MED ORDER — ROCURONIUM BROMIDE 100 MG/10ML IV SOLN
INTRAVENOUS | Status: DC | PRN
  Administered 2020-11-16: 80 mg via INTRAVENOUS

## 2020-11-16 MED ORDER — DEXMEDETOMIDINE (PRECEDEX) IN NS 20 MCG/5ML (4 MCG/ML) IV SYRINGE
PREFILLED_SYRINGE | INTRAVENOUS | Status: AC
  Filled 2020-11-16: qty 5

## 2020-11-16 MED ORDER — OXYCODONE HCL 5 MG PO TABS: 5.0000 mg | ORAL_TABLET | ORAL | 0 refills | Status: AC | PRN

## 2020-11-16 MED ORDER — ACETAMINOPHEN 500 MG PO TABS
ORAL_TABLET | ORAL | Status: AC
  Filled 2020-11-16: qty 2

## 2020-11-16 MED ORDER — ONDANSETRON HCL 4 MG/2ML IJ SOLN
INTRAMUSCULAR | Status: AC
  Filled 2020-11-16: qty 2

## 2020-11-16 MED ORDER — LIDOCAINE HCL (PF) 2 % IJ SOLN
INTRAMUSCULAR | Status: AC
  Filled 2020-11-16: qty 5

## 2020-11-16 MED ORDER — SCOPOLAMINE 1 MG/3DAYS TD PT72
MEDICATED_PATCH | TRANSDERMAL | Status: DC | PRN
  Administered 2020-11-16: 1 via TRANSDERMAL

## 2020-11-16 MED ORDER — DEXAMETHASONE SODIUM PHOSPHATE 4 MG/ML IJ SOLN
INTRAMUSCULAR | Status: DC | PRN
  Administered 2020-11-16: 10 mg via INTRAVENOUS

## 2020-11-16 MED ORDER — FENTANYL CITRATE (PF) 100 MCG/2ML IJ SOLN
INTRAMUSCULAR | Status: DC | PRN
  Administered 2020-11-16 (×2): 50 ug via INTRAVENOUS

## 2020-11-16 MED ORDER — BUPIVACAINE HCL (PF) 0.25 % IJ SOLN
INTRAMUSCULAR | Status: DC | PRN
  Administered 2020-11-16: 30 mL

## 2020-11-16 MED ORDER — PROPOFOL 10 MG/ML IV BOLUS
INTRAVENOUS | Status: DC | PRN
  Administered 2020-11-16: 150 mg via INTRAVENOUS
  Administered 2020-11-16: 50 mg via INTRAVENOUS

## 2020-11-16 MED ORDER — ACETAMINOPHEN 500 MG PO TABS
1000.0000 mg | ORAL_TABLET | Freq: Once | ORAL | Status: AC
  Administered 2020-11-16: 1000 mg via ORAL

## 2020-11-16 MED ORDER — DEXMEDETOMIDINE HCL 200 MCG/2ML IV SOLN
INTRAVENOUS | Status: DC | PRN
  Administered 2020-11-16: 4 ug via INTRAVENOUS
  Administered 2020-11-16: 8 ug via INTRAVENOUS
  Administered 2020-11-16 (×2): 4 ug via INTRAVENOUS

## 2020-11-16 MED ORDER — MIDAZOLAM HCL 5 MG/5ML IJ SOLN
INTRAMUSCULAR | Status: DC | PRN
  Administered 2020-11-16: 2 mg via INTRAVENOUS

## 2020-11-16 MED ORDER — KETOROLAC TROMETHAMINE 30 MG/ML IJ SOLN
INTRAMUSCULAR | Status: DC | PRN
  Administered 2020-11-16: 30 mg via INTRAVENOUS

## 2020-11-16 MED ORDER — ONDANSETRON HCL 4 MG/2ML IJ SOLN
INTRAMUSCULAR | Status: DC | PRN
  Administered 2020-11-16: 4 mg via INTRAVENOUS

## 2020-11-16 MED ORDER — SCOPOLAMINE 1 MG/3DAYS TD PT72
MEDICATED_PATCH | TRANSDERMAL | Status: AC
  Filled 2020-11-16: qty 1

## 2020-11-16 MED ORDER — LACTATED RINGERS IV SOLN: INTRAVENOUS | Status: DC

## 2020-11-16 MED ORDER — MIDAZOLAM HCL 2 MG/2ML IJ SOLN
INTRAMUSCULAR | Status: AC
  Filled 2020-11-16: qty 2

## 2020-11-16 SURGICAL SUPPLY — 32 items
ADH SKN CLS APL DERMABOND .7 (GAUZE/BANDAGES/DRESSINGS) ×1
COVER WAND RF STERILE (DRAPES) ×3 IMPLANT
DERMABOND ADVANCED (GAUZE/BANDAGES/DRESSINGS) ×2
DERMABOND ADVANCED .7 DNX12 (GAUZE/BANDAGES/DRESSINGS) ×1 IMPLANT
DRSG OPSITE POSTOP 3X4 (GAUZE/BANDAGES/DRESSINGS) ×3 IMPLANT
DURAPREP 26ML APPLICATOR (WOUND CARE) ×3 IMPLANT
GLOVE BIO SURGEON STRL SZ7 (GLOVE) ×6 IMPLANT
GLOVE BIOGEL PI IND STRL 6.5 (GLOVE) ×1 IMPLANT
GLOVE BIOGEL PI IND STRL 7.0 (GLOVE) ×4 IMPLANT
GLOVE BIOGEL PI INDICATOR 6.5 (GLOVE) ×2
GLOVE BIOGEL PI INDICATOR 7.0 (GLOVE) ×8
GLOVE ECLIPSE 6.5 STRL STRAW (GLOVE) ×3 IMPLANT
GLOVE SURG UNDER POLY LF SZ7 (GLOVE) ×9 IMPLANT
GOWN STRL REUS W/ TWL LRG LVL3 (GOWN DISPOSABLE) ×3 IMPLANT
GOWN STRL REUS W/TWL LRG LVL3 (GOWN DISPOSABLE) ×9
KIT TURNOVER CYSTO (KITS) ×3 IMPLANT
LIGASURE VESSEL 5MM BLUNT TIP (ELECTROSURGICAL) ×3 IMPLANT
MANIPULATOR UTERINE 4.5 ZUMI (MISCELLANEOUS) ×3 IMPLANT
NEEDLE INSUFFLATION 120MM (ENDOMECHANICALS) ×3 IMPLANT
PACK LAPAROSCOPY BASIN (CUSTOM PROCEDURE TRAY) ×3 IMPLANT
PACK TRENDGUARD 450 HYBRID PRO (MISCELLANEOUS) ×1 IMPLANT
PROTECTOR NERVE ULNAR (MISCELLANEOUS) ×3 IMPLANT
SET TUBE SMOKE EVAC HIGH FLOW (TUBING) ×3 IMPLANT
SLEEVE XCEL OPT CAN 5 100 (ENDOMECHANICALS) ×3 IMPLANT
SUT VICRYL 0 UR6 27IN ABS (SUTURE) ×3 IMPLANT
SUT VICRYL 4-0 PS2 18IN ABS (SUTURE) ×3 IMPLANT
SYR 5ML LL (SYRINGE) ×3 IMPLANT
TOWEL OR 17X26 10 PK STRL BLUE (TOWEL DISPOSABLE) ×3 IMPLANT
TRAY FOLEY W/BAG SLVR 14FR LF (SET/KITS/TRAYS/PACK) ×3 IMPLANT
TRENDGUARD 450 HYBRID PRO PACK (MISCELLANEOUS) ×3
TROCAR XCEL NON-BLD 5MMX100MML (ENDOMECHANICALS) ×6 IMPLANT
WARMER LAPAROSCOPE (MISCELLANEOUS) ×3 IMPLANT

## 2020-11-16 NOTE — Op Note (Signed)
Operative Note  Diane [name] DOB: 03-Sep-1986  PROCEDURE: laparoscopic right salpingectomy  PRE-OPERATIVE DIAGNOSIS: permanent female sterilization, history of left salpingectomy  POST-OPERATIVE DIAGNOSIS: permanent female sterilization, history of left salpingectomy  SURGEON: Dr. Clance Boll, DO  ASSISTANT: Dr. Juliene Pina, MD  FINDINGS: pelvic exam reveals small anteverted uterus mobile, normal appearing cervix. Laparoscopic general survey grossly normal including upper quadrants and liver edge. Female pelvic anatomy reveals small uterus with smooth normal contours, normal appearing left ovary with congested pelvic vessels, surgically absent left fallopian tube, normal appearing right ovary and fallopian tube  SPECIMENS: right fallopian tube  EBL: 10 cc  FLUIDS: 800 cc crystalloids  COMPLICATIONS: None  PROCEDURE IN DETAIL:   After the patient was appropriately consented in the holding area, she was taken to the operating room where general anesthesia was administered without complications. The patient was placed in the dorsal lithotomy position. Bilateral arms were tucked with the appropriate barrier padding. The patient was prepped and draped in the usual sterile fashion. The bladder was trained with a catheter. An appropriate time out was performed that verified the correct patient, procedure, and surgical team.   Pelvic exam performed. A sterile speculum was inserted into the vagina and the cervix was visualized. A single tooth tenaculum was used to grasp the anterior lip of the cervix. A Humi uterine manipulator was inserted through the cervix and secured into place. Attention was turned to the abdomen.  A scalpel was used to make a small trasnverse skin incision just inferior to the umbilicus. The Veress needle was advanced through the skin incision until 2 clicks were appreciated. The gas was connected and turned on and a low opening pressure was noted. Pneumoperitoneum was  achieved without complications. A 5 mm laparoscope and trocar sleeve was inserted through the umbilical incision and the abdomen was entered under direct visualization. Inspection of the abdomen revealed the findings as noted above and no obvious injuries noted. The patient was placed in Trendelenburg positioning to facilitate pelvic visualization. Lower quadrant ports were placed bilaterally at points approximately 2 cm superior and 2 cm medial to the ASIS. Two 5 mm trocars were placed under laparoscopic visualization. The LigaSure device was used to remove the right fallopian tube. Starting at the distal fimbriated ends, the LigaSure was used to sequentially cauterize and cut the mesosalpinx and the fallopian tube was transected medially near the cornual aspect. The specimen was removed under visualization intact through the right trocar. Specimen was sent for final pathology. Inspection of the surgical site revealed excellent hemostasis. Local anesthetic using 0.25% Marcaine was injected into the pelvis. The pressure was reduced and the patient laid flat and continued hemostasis noted. The gas was released from the abdomen. The trocars were remvoed under visualization. The skin incisions were closed with 4-0 Vicryl and skin glue. Local anesthetic using 0.25% Marcaine was injected at the incision sites. The uterine manipulator was removed. The patient tolerated the procedure well and was taken to the recovery room in stable condition. All lap, needle, and instrument counts were correct.  Milaina Sher A Chadwin Fury 11/16/20 3:14 PM

## 2020-11-16 NOTE — Anesthesia Procedure Notes (Signed)
Procedure Name: Intubation Date/Time: 11/16/2020 2:13 PM Performed by: Justice Rocher, CRNA Pre-anesthesia Checklist: Patient identified, Emergency Drugs available, Suction available, Patient being monitored and Timeout performed Patient Re-evaluated:Patient Re-evaluated prior to induction Oxygen Delivery Method: Circle system utilized Preoxygenation: Pre-oxygenation with 100% oxygen Induction Type: IV induction Ventilation: Mask ventilation without difficulty Laryngoscope Size: Mac and 3 Grade View: Grade II Tube type: Oral Number of attempts: 1 Airway Equipment and Method: Stylet and Oral airway Placement Confirmation: ETT inserted through vocal cords under direct vision,  positive ETCO2,  breath sounds checked- equal and bilateral and CO2 detector Secured at: 22 cm Tube secured with: Tape Dental Injury: Teeth and Oropharynx as per pre-operative assessment

## 2020-11-16 NOTE — Discharge Instructions (Signed)
Post Anesthesia Home Care Instructions  Activity: Get plenty of rest for the remainder of the day. A responsible adult should stay with you for 24 hours following the procedure.  For the next 24 hours, DO NOT: -Drive a car -Operate machinery -Drink alcoholic beverages -Take any medication unless instructed by your physician -Make any legal decisions or sign important papers.  Meals: Start with liquid foods such as gelatin or soup. Progress to regular foods as tolerated. Avoid greasy, spicy, heavy foods. If nausea and/or vomiting occur, drink only clear liquids until the nausea and/or vomiting subsides. Call your physician if vomiting continues.  Special Instructions/Symptoms: Your throat may feel dry or sore from the anesthesia or the breathing tube placed in your throat during surgery. If this causes discomfort, gargle with warm salt water. The discomfort should disappear within 24 hours.  If you had a scopolamine patch placed behind your ear for the management of post- operative nausea and/or vomiting:  1. The medication in the patch is effective for 72 hours, after which it should be removed.  Wrap patch in a tissue and discard in the trash. Wash hands thoroughly with soap and water. 2. You may remove the patch earlier than 72 hours if you experience unpleasant side effects which may include dry mouth, dizziness or visual disturbances. 3. Avoid touching the patch. Wash your hands with soap and water after contact with the patch.   DISCHARGE INSTRUCTIONS: Laparoscopy  The following instructions have been prepared to help you care for yourself upon your return home today.  Wound care: . Do not get the incision wet for the first 24 hours. The incision should be kept clean and dry. . The Band-Aids or dressings may be removed the day after surgery. . Should the incision become sore, red, and swollen after the first week, check with your doctor.  Personal hygiene: . Shower the day  after your procedure.  Activity and limitations: . Do NOT drive or operate any equipment today. . Do NOT lift anything more than 15 pounds for 2-3 weeks after surgery. . Do NOT rest in bed all day. . Walking is encouraged. Walk each day, starting slowly with 5-minute walks 3 or 4 times a day. Slowly increase the length of your walks. . Walk up and down stairs slowly. . Do NOT do strenuous activities, such as golfing, playing tennis, bowling, running, biking, weight lifting, gardening, mowing, or vacuuming for 2-4 weeks. Ask your doctor when it is okay to start.  Diet: Eat a light meal as desired this evening. You may resume your usual diet tomorrow.  Return to work: This is dependent on the type of work you do. For the most part you can return to a desk job within a week of surgery. If you are more active at work, please discuss this with your doctor.  What to expect after your surgery: You may have a slight burning sensation when you urinate on the first day. You may have a very small amount of blood in the urine. Expect to have a small amount of vaginal discharge/light bleeding for 1-2 weeks. It is not unusual to have abdominal soreness and bruising for up to 2 weeks. You may be tired and need more rest for about 1 week. You may experience shoulder pain for 24-72 hours. Lying flat in bed may relieve it.  Call your doctor for any of the following: . Develop a fever of 100.4 or greater . Inability to urinate 6 hours after discharge   from hospital . Severe pain not relieved by pain medications . Persistent of heavy bleeding at incision site . Redness or swelling around incision site after a week . Increasing nausea or vomiting  Patient Signature________________________________________ Nurse Signature_________________________________________ 

## 2020-11-16 NOTE — H&P (Signed)
Diane [name] is an 34 y.o. female. 34Y P2 desires permanent female sterilization. Patient has been on OCP's for contraception for many years. She is done with building her family and does not desire future pregnancies. She has been counseled on permanent nature of tubal sterilization at several in office visits and patient continues to be 100% certain she would like to proceed with tubal sterilization. History significant for prior ectopic pregnancy rupture resulting in left salpingectomy in 2013. No other surgical procedures. She is up to date on GYN AEX items including Pap. Medical history significant for migraines, currently on Depakote and Sumatriptan, as well as anxiety/depression on Lexapro and hydroxyzine, as well as appetite suppressant Phentermine for weight loss. Phentermine was stopped on Monday in preparation for surgery.  Menstrual History: Patient's last menstrual period was 09/23/2020.    Past Medical History:  Diagnosis Date  . Allergy    seasonal allergies  . Anxiety   . Arthritis    knees bilaterally  . Asthma    allergy induced asthma, no inhaler used  . Back pain   . Depression   . Ectopic pregnancy without intrauterine pregnancy 2013  . Family history of adverse reaction to anesthesia    both grandmother slow to awaken after anesthesia  . GERD (gastroesophageal reflux disease)   . Headache(784.0)    migraines  . History of kidney stones   . Hypertension   . Joint pain   . Lactose intolerance   . SVD (spontaneous vaginal delivery) 02/25/2014  . Vitamin D deficiency     Past Surgical History:  Procedure Laterality Date  . DILATION AND CURETTAGE OF UTERUS  11/18/2012   Procedure: DILATATION AND CURETTAGE;  Surgeon: Serita Kyle, MD;  Location: [facility];  Service: Gynecology;  Laterality: N/A;  . falopian tube removed  December 2013   had a tubal pregnancy  . LAPAROSCOPY  11/18/2012   Procedure: LAPAROSCOPY OPERATIVE;  Surgeon: Serita Kyle, MD;   Location: [facility];  Service: Gynecology;  Laterality: N/A;  Operative Laparoscopy with removal of left fallopian tube and ectopic pregnancy, Dilation and evacuation    Family History  Problem Relation Age of Onset  . Asthma Mother   . Obesity Mother   . Hypertension Mother   . Diabetes Father   . Hypertension Father   . Asthma Brother   . Asthma Daughter   . Cancer Maternal Grandmother   . Diabetes Paternal Grandmother   . Hypertension Paternal Grandmother   . Diabetes Paternal Grandfather   . Hypertension Paternal Grandfather   . Hearing loss Paternal Grandfather        work related  . Other Neg Hx     Social History:  reports that she has been smoking cigarettes. She has a 5.00 pack-year smoking history. She has never used smokeless tobacco. She reports current alcohol use. She reports that she does not use drugs.  Allergies: No Known Allergies  Medications Prior to Admission  Medication Sig Dispense Refill Last Dose  . AMLODIPINE BENZOATE PO Take 10 mg by mouth at bedtime.   11/15/2020 at Unknown time  . B Complex-Biotin-FA (SUPER B-50 B COMPLEX PO) Take by mouth daily.   11/15/2020 at Unknown time  . divalproex (DEPAKOTE) 250 MG DR tablet Take 250 mg by mouth 3 (three) times daily as needed. Takes qhs   11/15/2020 at Unknown time  . escitalopram (LEXAPRO) 20 MG tablet Take 20 mg by mouth at bedtime.    11/15/2020 at Unknown time  .  estradiol (VIVELLE-DOT) 0.1 MG/24HR patch Place 1 patch onto the skin 2 (two) times a week. Apply one patch 2 x a week during menstration   Past Month at Unknown time  . fluticasone (FLONASE) 50 MCG/ACT nasal spray Place 2 sprays into both nostrils daily. (Patient taking differently: Place 2 sprays into both nostrils as needed. ) 16 g 6 11/15/2020 at Unknown time  . hydrochlorothiazide (HYDRODIURIL) 25 MG tablet Take 25 mg by mouth at bedtime.   11/15/2020 at Unknown time  . hydrOXYzine (ATARAX/VISTARIL) 25 MG tablet Take 25 mg by mouth every 4 (four)  hours as needed. Takes 1 tab qhs   11/15/2020 at Unknown time  . loratadine (CLARITIN) 10 MG tablet Take 10 mg by mouth at bedtime.    11/15/2020 at Unknown time  . montelukast (SINGULAIR) 10 MG tablet Take 10 mg by mouth at bedtime.   11/15/2020 at Unknown time  . norgestimate-ethinyl estradiol (ORTHO-CYCLEN,SPRINTEC,PREVIFEM) 0.25-35 MG-MCG tablet Take 1 tablet by mouth at bedtime.    11/15/2020 at Unknown time  . omeprazole (PRILOSEC) 20 MG capsule Take 20 mg by mouth at bedtime.   11/15/2020 at Unknown time  . phentermine 15 MG capsule Take 15 mg by mouth every morning.   Past Week at 800  . SUMAtriptan (IMITREX) 25 MG tablet Take 25 mg by mouth every 2 (two) hours as needed for migraine. May repeat in 2 hours if headache persists or recurs.   Past Month at Unknown time  . Turmeric (QC TUMERIC COMPLEX PO) Take by mouth daily.   11/15/2020 at Unknown time  . UNABLE TO FIND Probiotic 1 daily   11/15/2020 at Unknown time  . Chlorphen-PE-Acetaminophen (NOREL AD) 4-10-325 MG TABS Take 1 tablet by mouth. Take one tablet by mouth every 4-6 hours as needed   Unknown at Unknown time    Review of Systems  All other systems reviewed and are negative.   Blood pressure 124/74, pulse 78, temperature 99.2 F (37.3 C), temperature source Oral, resp. rate 20, height 5\' 2"  (1.575 m), weight 94.4 kg, last menstrual period 09/23/2020, SpO2 100 %, unknown if currently breastfeeding. Physical Exam Vitals reviewed.  Constitutional:      Appearance: Normal appearance.  HENT:     Head: Normocephalic.  Cardiovascular:     Rate and Rhythm: Normal rate and regular rhythm.  Pulmonary:     Effort: Pulmonary effort is normal.     Breath sounds: Normal breath sounds.  Abdominal:     Palpations: Abdomen is soft.     Tenderness: There is no abdominal tenderness.  Genitourinary:    General: Normal vulva.  Musculoskeletal:        General: Normal range of motion.     Cervical back: Normal range of motion.  Skin:     General: Skin is warm and dry.  Neurological:     General: No focal deficit present.     Mental Status: She is alert.  Psychiatric:        Mood and Affect: Mood normal.        Behavior: Behavior normal.     Results for orders placed or performed during the hospital encounter of 11/16/20 (from the past 24 hour(s))  CBC     Status: Abnormal   Collection Time: 11/16/20 12:35 PM  Result Value Ref Range   WBC 11.8 (H) 4.0 - 10.5 K/uL   RBC 4.61 3.87 - 5.11 MIL/uL   Hemoglobin 13.0 12.0 - 15.0 g/dL   HCT 14/06/21 36 -  46 %   MCV 82.9 80.0 - 100.0 fL   MCH 28.2 26.0 - 34.0 pg   MCHC 34.0 30.0 - 36.0 g/dL   RDW 95.1 88.4 - 16.6 %   Platelets 327 150 - 400 K/uL   nRBC 0.0 0.0 - 0.2 %  hCG, serum, qualitative     Status: None   Collection Time: 11/16/20 12:35 PM  Result Value Ref Range   Preg, Serum NEGATIVE NEGATIVE  I-STAT, chem 8     Status: Abnormal   Collection Time: 11/16/20 12:40 PM  Result Value Ref Range   Sodium 138 135 - 145 mmol/L   Potassium 3.4 (L) 3.5 - 5.1 mmol/L   Chloride 100 98 - 111 mmol/L   BUN 6 6 - 20 mg/dL   Creatinine, Ser 0.63 0.44 - 1.00 mg/dL   Glucose, Bld 88 70 - 99 mg/dL   Calcium, Ion 0.16 0.10 - 1.40 mmol/L   TCO2 25 22 - 32 mmol/L   Hemoglobin 13.9 12.0 - 15.0 g/dL   HCT 93.2 36 - 46 %     Assessment/Plan: 34Y P2 LMP 09/13/20 desires permanent female sterilization  Patient consented for laparoscopic right salpingectomy and possible left partial salpingectomy. Review of risks including but not limited to bleeding, infection, and damage to surrounding organs reviewed. Consents signed and placed in patient chart. Right side initialed.   -No antibiotics indicated -SCD VTE ppx -Foley catheter intraop -NPO -Routine preop/postop care -DC instructions reviewed with patient -Anticipate discharge home  Elie Gragert A Geselle Cardosa 11/16/2020, 1:18 PM

## 2020-11-16 NOTE — Anesthesia Preprocedure Evaluation (Addendum)
Anesthesia Evaluation  Patient identified by MRN, date of birth, ID band Patient awake    Reviewed: Allergy & Precautions, NPO status , Patient's Chart, lab work & pertinent test results  Airway Mallampati: II  TM Distance: >3 FB Neck ROM: Full    Dental no notable dental hx. (+) Teeth Intact, Dental Advisory Given   Pulmonary asthma , Current Smoker and Patient abstained from smoking.,    Pulmonary exam normal breath sounds clear to auscultation       Cardiovascular hypertension, Pt. on medications Normal cardiovascular exam Rhythm:Regular Rate:Normal     Neuro/Psych  Headaches, PSYCHIATRIC DISORDERS Anxiety Depression    GI/Hepatic Neg liver ROS, GERD  Medicated and Controlled,  Endo/Other  negative endocrine ROSObese BMI 38  Renal/GU negative Renal ROS  negative genitourinary   Musculoskeletal  (+) Arthritis ,   Abdominal   Peds  Hematology negative hematology ROS (+)   Anesthesia Other Findings   Reproductive/Obstetrics                            Anesthesia Physical Anesthesia Plan  ASA: II  Anesthesia Plan: General   Post-op Pain Management:    Induction: Intravenous  PONV Risk Score and Plan: 2 and Midazolam, Dexamethasone and Ondansetron  Airway Management Planned: Oral ETT  Additional Equipment:   Intra-op Plan:   Post-operative Plan: Extubation in OR  Informed Consent: I have reviewed the patients History and Physical, chart, labs and discussed the procedure including the risks, benefits and alternatives for the proposed anesthesia with the patient or authorized representative who has indicated his/her understanding and acceptance.     Dental advisory given  Plan Discussed with: CRNA  Anesthesia Plan Comments:         Anesthesia Quick Evaluation

## 2020-11-16 NOTE — Transfer of Care (Signed)
Immediate Anesthesia Transfer of Care Note  Patient: Diane [name]  Procedure(s) Performed: LAPAROSCOPIC Right TUBAL LIGATION By Salpingectomy (Right Abdomen)  Patient Location: PACU  Anesthesia Type:General  Level of Consciousness: sedated  Airway & Oxygen Therapy: Patient Spontanous Breathing and Patient connected to face mask oxygen  Post-op Assessment: Report given to RN and Post -op Vital signs reviewed and stable  Post vital signs: Reviewed and stable  Last Vitals:  Vitals Value Taken Time  BP 103/52 11/16/20 1507  Temp    Pulse 93 11/16/20 1511  Resp 19 11/16/20 1511  SpO2 100 % 11/16/20 1511  Vitals shown include unvalidated device data.  Last Pain:  Vitals:   11/16/20 1237  TempSrc: Oral  PainSc: 0-No pain      Patients Stated Pain Goal: 5 (11/16/20 1237)  Complications: No complications documented.

## 2020-11-17 ENCOUNTER — Encounter (HOSPITAL_BASED_OUTPATIENT_CLINIC_OR_DEPARTMENT_OTHER): Payer: Self-pay | Admitting: Obstetrics and Gynecology

## 2020-11-17 LAB — SURGICAL PATHOLOGY

## 2020-11-17 NOTE — Anesthesia Postprocedure Evaluation (Signed)
Anesthesia Post Note  Patient: Diane [name]  Procedure(s) Performed: LAPAROSCOPIC Right TUBAL LIGATION By Salpingectomy (Right Abdomen)     Patient location during evaluation: PACU Anesthesia Type: General Level of consciousness: awake and alert Pain management: pain level controlled Vital Signs Assessment: post-procedure vital signs reviewed and stable Respiratory status: spontaneous breathing, nonlabored ventilation, respiratory function stable and patient connected to nasal cannula oxygen Cardiovascular status: blood pressure returned to baseline and stable Postop Assessment: no apparent nausea or vomiting Anesthetic complications: no   No complications documented.  Last Vitals:  Vitals:   11/16/20 1601 11/16/20 1627  BP:  (!) 98/57  Pulse: 87 82  Resp: 20 20  Temp: 36.9 C   SpO2: 100% 100%    Last Pain:  Vitals:   11/16/20 1601  TempSrc: Oral  PainSc:                  Evanell Redlich L Jeffrey Graefe

## 2021-01-26 ENCOUNTER — Encounter: Payer: Self-pay | Admitting: Neurology

## 2021-01-26 ENCOUNTER — Ambulatory Visit: Payer: 59 | Admitting: Neurology

## 2021-01-26 DIAGNOSIS — H4710 Unspecified papilledema: Secondary | ICD-10-CM

## 2021-01-26 DIAGNOSIS — G43019 Migraine without aura, intractable, without status migrainosus: Secondary | ICD-10-CM

## 2021-01-26 DIAGNOSIS — G4489 Other headache syndrome: Secondary | ICD-10-CM | POA: Diagnosis not present

## 2021-01-26 HISTORY — DX: Migraine without aura, intractable, without status migrainosus: G43.019

## 2021-01-26 MED ORDER — SUMATRIPTAN SUCCINATE 100 MG PO TABS: 100.0000 mg | ORAL_TABLET | Freq: Two times a day (BID) | ORAL | 3 refills | Status: AC | PRN

## 2021-01-26 MED ORDER — PREDNISONE 5 MG PO TABS: ORAL_TABLET | ORAL | 0 refills | Status: DC

## 2021-01-26 NOTE — Progress Notes (Signed)
Daily migraines  For headache management pt has tried and failed : ibuprofen, Excedrin, Tylenol, sumatriptan. On Emgality since Dec 2021.

## 2021-01-26 NOTE — Progress Notes (Signed)
Reason for visit: Migraine headache, papilledema  Referring physician: Dr. Conley Rolls  Diane [name] is a 35 y.o. female  History of present illness:  Diane [name] is a 35 year old right-handed black female with a history of migraine headaches since she was 35 years old.  She indicates that the headaches began when she started her menstrual cycle.  The headaches have always been worse around the menstrual cycle, but the severity the headaches clearly worsened after delivery of her first child several years ago.  The patient indicates that treatment with birth control pills and estrogen patches seemed to markedly reduce the frequency of her headaches, 6 months ago she was only having 2 or 3 headache days a month.  Within the last 2 to 3 weeks, she has had headaches that converted to daily in nature.  The headaches usually start in the left frontotemporal area, occasionally on the right.  They will spread backwards to the back of the head associated with photophobia, phonophobia, and nausea and vomiting.  The patient does not note any particular activating factors other than the menstrual cycle that brings on a headache.  The patient went off of her birth control pills in December 2021 after she had a tubal ligation.  The headaches have significantly worsened off the birth control.  The patient reports some blurring of vision with the headache and decreased concentration.  She denies any focal numbness or weakness or aphasia associated with the headache.  She drinks 2 Mountain Dew beverages daily, no other caffeinated drinks.  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  She takes 25 mg Imitrex and she has been on Depakote for about a year, she only takes 250 mg daily.  She has noted weight gain and hair loss on the medication.  She denies any family history of migraine.  The patient had a recent eye examination and there was some question of mild papilledema, she is  sent to this office for an evaluation.  Past Medical History:  Diagnosis Date  . Allergy    seasonal allergies  . Anxiety   . Arthritis    knees bilaterally  . Asthma    allergy induced asthma, no inhaler used  . Back pain   . Depression   . Ectopic pregnancy without intrauterine pregnancy 2013  . Family history of adverse reaction to anesthesia    both grandmother slow to awaken after anesthesia  . GERD (gastroesophageal reflux disease)   . Headache(784.0)    migraines  . History of kidney stones   . Hypertension   . Joint pain   . Lactose intolerance   . SVD (spontaneous vaginal delivery) 02/25/2014  . Vitamin D deficiency     Past Surgical History:  Procedure Laterality Date  . DILATION AND CURETTAGE OF UTERUS  11/18/2012   Procedure: DILATATION AND CURETTAGE;  Surgeon: Serita Kyle, MD;  Location: [facility];  Service: Gynecology;  Laterality: N/A;  . falopian tube removed  December 2013   had a tubal pregnancy  . LAPAROSCOPIC TUBAL LIGATION Right 11/16/2020   Procedure: LAPAROSCOPIC Right TUBAL LIGATION By Salpingectomy;  Surgeon: Toy Baker, DO;  Location: [facility];  Service: Gynecology;  Laterality: Right;  . LAPAROSCOPY  11/18/2012   Procedure: LAPAROSCOPY OPERATIVE;  Surgeon: Serita Kyle, MD;  Location: [facility];  Service: Gynecology;  Laterality: N/A;  Operative Laparoscopy with removal of left fallopian tube and ectopic pregnancy,  Dilation and evacuation    Family History  Problem Relation Age of Onset  . Asthma Mother   . Obesity Mother   . Hypertension Mother   . Diabetes Father   . Hypertension Father   . Asthma Brother   . Asthma Daughter   . Cancer Maternal Grandmother        breast  . Diabetes Paternal Grandmother   . Hypertension Paternal Grandmother   . Diabetes Paternal Grandfather   . Hypertension Paternal Grandfather   . Hearing loss Paternal Grandfather        work related  . Other Neg Hx     Social  history:  reports that she has been smoking cigarettes. She has a 5.00 pack-year smoking history. She has never used smokeless tobacco. She reports current alcohol use. She reports that she does not use drugs.  Medications:  Prior to Admission medications   Medication Sig Start Date End Date Taking? Authorizing Provider  AMLODIPINE BENZOATE PO Take 10 mg by mouth at bedtime.   Yes [provider]  B Complex-Biotin-FA (SUPER B-50 B COMPLEX PO) Take by mouth daily.   Yes [provider]  divalproex (DEPAKOTE) 250 MG DR tablet Take 250 mg by mouth 3 (three) times daily as needed. Takes qhs   Yes [provider]  escitalopram (LEXAPRO) 20 MG tablet Take 20 mg by mouth at bedtime.    Yes [provider]  fluticasone (FLONASE) 50 MCG/ACT nasal spray Place 2 sprays into both nostrils daily. Patient taking differently: Place 2 sprays into both nostrils as needed. 08/19/16  Yes Shade Flood, MD  hydrochlorothiazide (HYDRODIURIL) 25 MG tablet Take 25 mg by mouth at bedtime.   Yes [provider]  hydrOXYzine (ATARAX/VISTARIL) 25 MG tablet Take 25 mg by mouth every 4 (four) hours as needed. Takes 1 tab qhs   Yes [provider]  loratadine (CLARITIN) 10 MG tablet Take 10 mg by mouth at bedtime.    Yes [provider]  montelukast (SINGULAIR) 10 MG tablet Take 10 mg by mouth at bedtime.   Yes [provider]  norgestimate-ethinyl estradiol (ORTHO-CYCLEN,SPRINTEC,PREVIFEM) 0.25-35 MG-MCG tablet Take 1 tablet by mouth at bedtime.    Yes [provider]  omeprazole (PRILOSEC) 20 MG capsule Take 20 mg by mouth at bedtime.   Yes [provider]  SUMAtriptan (IMITREX) 25 MG tablet Take 25 mg by mouth every 2 (two) hours as needed for migraine. May repeat in 2 hours if headache persists or recurs.   Yes [provider]  UNABLE TO FIND Probiotic 1 daily   Yes [provider]  buPROPion (WELLBUTRIN) 100 MG  tablet Take 100 mg by mouth daily. 01/18/21   [provider]  Chlorphen-PE-Acetaminophen 4-10-325 MG TABS Take 1 tablet by mouth. Take one tablet by mouth every 4-6 hours as needed Patient not taking: Reported on 01/26/2021    [provider]  estradiol (VIVELLE-DOT) 0.1 MG/24HR patch Place 1 patch onto the skin 2 (two) times a week. Apply one patch 2 x a week during menstration Patient not taking: Reported on 01/26/2021    [provider]  ibuprofen (ADVIL) 800 MG tablet Take 1 tablet (800 mg total) by mouth every 8 (eight) hours as needed. Patient not taking: Reported on 01/26/2021 11/16/20   Law, Cassandra A, DO  phentermine 30 MG capsule Take 30 mg by mouth daily. 01/13/21   [provider]  promethazine (PHENERGAN) 12.5 MG tablet Take 12.5 mg by  mouth every 8 (eight) hours as needed. 01/21/21   [provider]  Turmeric (QC TUMERIC COMPLEX PO) Take by mouth daily. Patient not taking: Reported on 01/26/2021    [provider]     No Known Allergies  ROS:  Out of a complete 14 system review of symptoms, the patient complains only of the following symptoms, and all other reviewed systems are negative.  Headache Blurred vision Nausea  Blood pressure 113/76, pulse (!) 111, height 5\' 3"  (1.6 m), weight 200 lb 9.6 oz (91 kg), unknown if currently breastfeeding.  Physical Exam  General: The patient is alert and cooperative at the time of the examination.  The patient is moderately obese.  Eyes: Pupils are equal, round, and reactive to light. Discs are flat bilaterally.  Neck: The neck is supple, no carotid bruits are noted.  Respiratory: The respiratory examination is clear.  Cardiovascular: The cardiovascular examination reveals a regular rate and rhythm, no obvious murmurs or rubs are noted.  Skin: Extremities are without significant edema.  Neurologic Exam  Mental status: The patient is alert and oriented x 3 at the time of the  examination. The patient has apparent normal recent and remote memory, with an apparently normal attention span and concentration ability.  Cranial nerves: Facial symmetry is present. There is good sensation of the face to pinprick and soft touch bilaterally. The strength of the facial muscles and the muscles to head turning and shoulder shrug are normal bilaterally. Speech is well enunciated, no aphasia or dysarthria is noted. Extraocular movements are full. Visual fields are full. The tongue is midline, and the patient has symmetric elevation of the soft palate. No obvious hearing deficits are noted.  Motor: The motor testing reveals 5 over 5 strength of all 4 extremities. Good symmetric motor tone is noted throughout.  Sensory: Sensory testing is intact to pinprick, soft touch, vibration sensation, and position sense on all 4 extremities. No evidence of extinction is noted.  Coordination: Cerebellar testing reveals good finger-nose-finger and heel-to-shin bilaterally.  Gait and station: Gait is normal. Tandem gait is normal. Romberg is negative. No drift is seen.  Reflexes: Deep tendon reflexes are symmetric and normal bilaterally. Toes are downgoing bilaterally.   Assessment/Plan:  1.  Common migraine headache, intractable  2.  Mild papilledema  The patient will be evaluated for possible pseudotumor cerebri.  MRI of the brain will be done, lumbar puncture will be done following this if the MRI is unremarkable.  The patient appears to have had converted migraine after cessation of birth control hormonal therapy.  The patient will stop her Depakote, a prescription for the 100 mg Imitrex tablets will be given.  The patient will try to reduce some of her caffeine intake.  She will follow up here in 3 months.  I would like to get her started on Topamax as soon as we can get the lumbar puncture following the MRI study.  A note was given to keep the patient out of work another week.  MD 01/26/2021 8:08 AM  [facility] [address] [place], [place]  Phone [phone] Fax [phone]

## 2021-01-27 ENCOUNTER — Telehealth: Payer: Self-pay | Admitting: Neurology

## 2021-01-27 NOTE — Telephone Encounter (Signed)
no to the covid questions MR Brain w/wo contrast Dr. Anne Hahn [facility]: [id number] (exp. 01/26/21 to 03/12/21). Patient is scheduled at [facility] for 02/02/21.

## 2021-02-02 ENCOUNTER — Other Ambulatory Visit: Payer: Self-pay

## 2021-02-02 ENCOUNTER — Encounter: Payer: Self-pay | Admitting: Neurology

## 2021-02-02 ENCOUNTER — Ambulatory Visit: Payer: 59

## 2021-02-02 DIAGNOSIS — G4489 Other headache syndrome: Secondary | ICD-10-CM | POA: Diagnosis not present

## 2021-02-02 MED ORDER — GADOBENATE DIMEGLUMINE 529 MG/ML IV SOLN
15.0000 mL | Freq: Once | INTRAVENOUS | Status: AC | PRN
  Administered 2021-02-02: 15 mL via INTRAVENOUS

## 2021-02-05 ENCOUNTER — Telehealth: Payer: Self-pay | Admitting: Neurology

## 2021-02-05 DIAGNOSIS — H4710 Unspecified papilledema: Secondary | ICD-10-CM

## 2021-02-05 NOTE — Telephone Encounter (Signed)
  I called the patient.  MRI of the brain was normal, I will get a lumbar puncture set up to check the opening pressure to evaluate the papilledema.  The patient is amenable to this.  MRI brain 02/05/21:  IMPRESSION:   Normal MRI brain (with and without).

## 2021-02-08 ENCOUNTER — Telehealth: Payer: Self-pay | Admitting: Neurology

## 2021-02-08 ENCOUNTER — Ambulatory Visit
Admission: RE | Admit: 2021-02-08 | Discharge: 2021-02-08 | Disposition: A | Discharge status: Home / Self Care | Payer: 59 | Source: Ambulatory Visit | Attending: Neurology | Admitting: Neurology

## 2021-02-08 ENCOUNTER — Other Ambulatory Visit: Payer: Self-pay

## 2021-02-08 VITALS — BP 127/70 | HR 89

## 2021-02-08 DIAGNOSIS — H4710 Unspecified papilledema: Secondary | ICD-10-CM

## 2021-02-08 LAB — CSF CELL COUNT WITH DIFFERENTIAL
RBC Count, CSF: 0 cells/uL
WBC, CSF: 0 cells/uL (ref 0–5)

## 2021-02-08 LAB — PROTEIN, CSF: Total Protein, CSF: 30 mg/dL (ref 15–45)

## 2021-02-08 LAB — GLUCOSE, CSF: Glucose, CSF: 64 mg/dL (ref 40–80)

## 2021-02-08 MED ORDER — TOPIRAMATE 25 MG PO TABS: ORAL_TABLET | ORAL | 3 refills | Status: DC

## 2021-02-08 NOTE — Discharge Instructions (Signed)

## 2021-02-08 NOTE — Telephone Encounter (Signed)
I called the patient.  Lumbar puncture was done today, opening pressure was 22.  This does not meet the criteria for pseudotumor cerebri, it must be over 25 in an adult.  The patient however likely has migraine headache, she is to wait 2 to 3 days and we will start Topamax for the headache.

## 2021-02-16 ENCOUNTER — Telehealth: Payer: Self-pay | Admitting: Neurology

## 2021-02-16 NOTE — Telephone Encounter (Signed)
Pt called, I know you started me on Topamax, do you still want me to get my Emgality injection? Would like a call from the nurse.

## 2021-02-17 NOTE — Telephone Encounter (Signed)
I called the patient.  The patient has been on Emgality since January 2022.  She is still having a lot of pressure sensations in the head and discomfort, I have indicated that she is okay to start the Topamax with the Emgality.

## 2021-05-03 ENCOUNTER — Ambulatory Visit: Payer: 59 | Admitting: Neurology

## 2021-05-03 ENCOUNTER — Encounter: Payer: Self-pay | Admitting: Neurology

## 2021-05-03 VITALS — BP 112/78 | HR 96 | Ht 63.0 in | Wt 192.0 lb

## 2021-05-03 DIAGNOSIS — G43019 Migraine without aura, intractable, without status migrainosus: Secondary | ICD-10-CM | POA: Diagnosis not present

## 2021-05-03 DIAGNOSIS — H4710 Unspecified papilledema: Secondary | ICD-10-CM

## 2021-05-03 MED ORDER — TOPIRAMATE 100 MG PO TABS: 100.0000 mg | ORAL_TABLET | Freq: Every day | ORAL | 1 refills | Status: DC

## 2021-05-03 NOTE — Patient Instructions (Addendum)
Increase Topamax 100 mg at bedtime, let me know 1-2 weeks how you are doing  If helpful, will continue to increase; if symptoms don't improve may need to see eye doctor See you back in 3-4 months

## 2021-05-03 NOTE — Progress Notes (Signed)
I have read the note, and I agree with the clinical assessment and plan.  Diane [name] K Hodge Stachnik   

## 2021-05-03 NOTE — Progress Notes (Signed)
PATIENT: Diane [name] DOB: [birth year]  REASON FOR VISIT: follow up HISTORY FROM: patient  HISTORY OF PRESENT ILLNESS: Today 05/03/21 Diane [name] is a 35 year old female with history of migraine headache.  She was referred to this office for mild papilledema.  MRI of the brain was normal. LP showed opening pressure 22, does not meet criteria for pseudotumor cerebri.  She was started on Topamax.  Currently taking Topamax 75 mg at bedtime.  She no longer complains of any headache.  LP was in February.  She saw her ophthalmologist in March, reports there was no swelling to the optic nerves.  For the last 4 to 6 weeks, reports slow onset of recurrent symptoms including lightheaded sensation when bending over, feeling woozy when rising.  Intermittent visual disturbance that is blurry, or tunnel vision.  The symptoms were present prior to LP, but nothing as bad as before.  Symptoms all came on suddenly in January 2022.  Since seen in February, she has lost 8 pounds.  She works as a Architectural technologist.  Here today for evaluation unaccompanied.  HISTORY 01/26/2021 Dr. Anne Hahn: Diane [name] is a 35 year old right-handed black female with a history of migraine headaches since she was 35 years old.  She indicates that the headaches began when she started her menstrual cycle.  The headaches have always been worse around the menstrual cycle, but the severity the headaches clearly worsened after delivery of her first child several years ago.  The patient indicates that treatment with birth control pills and estrogen patches seemed to markedly reduce the frequency of her headaches, 6 months ago she was only having 2 or 3 headache days a month.  Within the last 2 to 3 weeks, she has had headaches that converted to daily in nature.  The headaches usually start in the left frontotemporal area, occasionally on the right.  They will spread backwards to the back of the head associated with photophobia, phonophobia, and  nausea and vomiting.  The patient does not note any particular activating factors other than the menstrual cycle that brings on a headache.  The patient went off of her birth control pills in December 2021 after she had a tubal ligation.  The headaches have significantly worsened off the birth control.  The patient reports some blurring of vision with the headache and decreased concentration.  She denies any focal numbness or weakness or aphasia associated with the headache.  She drinks 2 Mountain Dew beverages daily, no other caffeinated drinks.  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  She takes 25 mg Imitrex and she has been on Depakote for about a year, she only takes 250 mg daily.  She has noted weight gain and hair loss on the medication.  She denies any family history of migraine.  The patient had a recent eye examination and there was some question of mild papilledema, she is sent to this office for an evaluation.  REVIEW OF SYSTEMS: Out of a complete 14 system review of symptoms, the patient complains only of the following symptoms, and all other reviewed systems are negative.  See HPI  ALLERGIES: No Known Allergies  HOME MEDICATIONS: Outpatient Medications Prior to Visit  Medication Sig Dispense Refill  . AZO-CRANBERRY PO Take 2 tablets by mouth daily.    . B Complex-Biotin-FA (SUPER B-50 B COMPLEX PO) Take by mouth daily.    Marland Kitchen buPROPion (WELLBUTRIN) 100 MG tablet Take 100 mg  by mouth daily.    Marland Kitchen escitalopram (LEXAPRO) 20 MG tablet Take 20 mg by mouth at bedtime.     . fluticasone (FLONASE) 50 MCG/ACT nasal spray Place 2 sprays into both nostrils daily. (Patient taking differently: Place 2 sprays into both nostrils as needed.) 16 g 6  . hydrochlorothiazide (HYDRODIURIL) 25 MG tablet Take 25 mg by mouth at bedtime.    . hydrOXYzine (ATARAX/VISTARIL) 25 MG tablet Take 25 mg by mouth every 4 (four) hours as needed. Takes 1 tab qhs    . ibuprofen  (ADVIL) 200 MG tablet Take 200 mg by mouth every 6 (six) hours as needed.    . loratadine (CLARITIN) 10 MG tablet Take 10 mg by mouth at bedtime.     . montelukast (SINGULAIR) 10 MG tablet Take 10 mg by mouth at bedtime.    . phentermine 30 MG capsule Take 30 mg by mouth daily.    . promethazine (PHENERGAN) 12.5 MG tablet Take 12.5 mg by mouth every 8 (eight) hours as needed.    . SUMAtriptan (IMITREX) 100 MG tablet Take 1 tablet (100 mg total) by mouth 2 (two) times daily as needed for migraine. 10 tablet 3  . UNABLE TO FIND Probiotic 1 daily    . AMLODIPINE BENZOATE PO Take 10 mg by mouth at bedtime.    . topiramate (TOPAMAX) 25 MG tablet Take one tablet at night for one week, then take 2 tablets at night for one week, then take 3 tablets at night. 90 tablet 3  . chlorhexidine (PERIDEX) 0.12 % solution SMARTSIG:By Mouth    . aspirin-acetaminophen-caffeine (EXCEDRIN MIGRAINE) 250-250-65 MG tablet Take by mouth every 6 (six) hours as needed for headache.    . Galcanezumab-gnlm (EMGALITY) 120 MG/ML SOAJ Inject 120 mg into the skin every 30 (thirty) days.    Marland Kitchen omeprazole (PRILOSEC) 20 MG capsule Take 20 mg by mouth at bedtime.    . predniSONE (DELTASONE) 5 MG tablet Begin taking 6 tablets daily, taper by one tablet daily until off the medication. 21 tablet 0  . UNABLE TO FIND Med Name:happy cycle    . UNABLE TO FIND Med Name: Sleep and Shine     No facility-administered medications prior to visit.    PAST MEDICAL HISTORY: Past Medical History:  Diagnosis Date  . Allergy    seasonal allergies  . Anxiety   . Arthritis    knees bilaterally  . Asthma    allergy induced asthma, no inhaler used  . Back pain   . Common migraine with intractable migraine 01/26/2021  . Depression   . Ectopic pregnancy without intrauterine pregnancy 2013  . Family history of adverse reaction to anesthesia    both grandmother slow to awaken after anesthesia  . GERD (gastroesophageal reflux disease)   .  Headache(784.0)    migraines  . History of kidney stones   . Hypertension   . Joint pain   . Lactose intolerance   . SVD (spontaneous vaginal delivery) 02/25/2014  . Vitamin D deficiency     PAST SURGICAL HISTORY: Past Surgical History:  Procedure Laterality Date  . DILATION AND CURETTAGE OF UTERUS  11/18/2012   Procedure: DILATATION AND CURETTAGE;  Surgeon: Serita Kyle, MD;  Location: [facility];  Service: Gynecology;  Laterality: N/A;  . falopian tube removed  December 2013   had a tubal pregnancy  . LAPAROSCOPIC TUBAL LIGATION Right 11/16/2020   Procedure: LAPAROSCOPIC Right TUBAL LIGATION By Salpingectomy;  Surgeon: Clance Boll A, DO;  Location: [facility];  Service: Gynecology;  Laterality: Right;  . LAPAROSCOPY  11/18/2012   Procedure: LAPAROSCOPY OPERATIVE;  Surgeon: Serita Kyle, MD;  Location: [facility];  Service: Gynecology;  Laterality: N/A;  Operative Laparoscopy with removal of left fallopian tube and ectopic pregnancy, Dilation and evacuation    FAMILY HISTORY: Family History  Problem Relation Age of Onset  . Asthma Mother   . Obesity Mother   . Hypertension Mother   . Diabetes Father   . Hypertension Father   . Asthma Brother   . Asthma Daughter   . Cancer Maternal Grandmother        breast  . Diabetes Paternal Grandmother   . Hypertension Paternal Grandmother   . Diabetes Paternal Grandfather   . Hypertension Paternal Grandfather   . Hearing loss Paternal Grandfather        work related  . Other Neg Hx     SOCIAL HISTORY: Social History   Socioeconomic History  . Marital status: Married    Spouse name: Marzettis Hughes Better  . Number of children: 2  . Years of education: Not on file  . Highest education level: Bachelor's degree (e.g., BA, AB, BS)  Occupational History  . 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 Smoking status: Current Every Day Smoker    Packs/day:  0.50    Years: 10.00    Pack years: 5.00    Types: Cigarettes  . Smokeless tobacco: Never Used  Vaping Use  . Vaping Use: Never used  Substance and Sexual Activity  . Alcohol use: Yes    Comment: occ  . Drug use: Never  . Sexual activity: Yes  Other Topics Concern  . Not on file  Social History Narrative   Lives with family   Caffeine- 2 + c daily   Social Determinants of Health   Financial Resource Strain: Not on file  Food Insecurity: Not on file  Transportation Needs: Not on file  Physical Activity: Not on file  Stress: Not on file  Social Connections: Not on file  Intimate Partner Violence: Not on file   PHYSICAL EXAM  Vitals:   05/03/21 1447  BP: 112/78  Pulse: 96  Weight: 192 lb (87.1 kg)  Height: 5\' 3"  (1.6 m)   Body mass index is 34.01 kg/m.  Generalized: Well developed, in no acute distress   Neurological examination  Mentation: Alert oriented to time, place, history taking. Follows all commands speech and language fluent Cranial nerve II-XII: Pupils were equal round reactive to light. Extraocular movements were full, visual field were full on confrontational test. Facial sensation and strength were normal. Head turning and shoulder shrug  were normal and symmetric. Motor: The motor testing reveals 5 over 5 strength of all 4 extremities. Good symmetric motor tone is noted throughout.  Sensory: Sensory testing is intact to soft touch on all 4 extremities. No evidence of extinction is noted.  Coordination: Cerebellar testing reveals good finger-nose-finger and heel-to-shin bilaterally.  Gait and station: Gait is normal. Tandem gait is normal. Romberg is negative. No drift is seen.  Reflexes: Deep tendon reflexes are symmetric and normal bilaterally.   DIAGNOSTIC DATA (LABS, IMAGING, TESTING) - I reviewed patient records, labs, notes, testing and imaging myself where available.  Lab Results  Component Value Date   WBC 11.8 (H) 11/16/2020   HGB 13.9  11/16/2020   HCT 41.0 11/16/2020   MCV 82.9 11/16/2020  PLT 327 11/16/2020      Component Value Date/Time   NA 138 11/16/2020 1240   NA 140 01/17/2019 0850   K 3.4 (L) 11/16/2020 1240   CL 100 11/16/2020 1240   CO2 21 01/17/2019 0850   GLUCOSE 88 11/16/2020 1240   BUN 6 11/16/2020 1240   BUN 8 01/17/2019 0850   CREATININE 0.80 11/16/2020 1240   CALCIUM 8.8 01/17/2019 0850   PROT 6.8 01/17/2019 0850   ALBUMIN 4.0 01/17/2019 0850   AST 8 01/17/2019 0850   ALT 9 01/17/2019 0850   ALKPHOS 54 01/17/2019 0850   BILITOT 0.2 01/17/2019 0850   GFRNONAA 72 01/17/2019 0850   GFRAA 83 01/17/2019 0850   Lab Results  Component Value Date   CHOL 199 01/17/2019   HDL 76 01/17/2019   LDLCALC 101 (H) 01/17/2019   TRIG 108 01/17/2019   Lab Results  Component Value Date   HGBA1C 5.2 01/17/2019   Lab Results  Component Value Date   VITAMINB12 645 09/10/2018   Lab Results  Component Value Date   TSH 5.580 (H) 01/17/2019   ASSESSMENT AND PLAN 35 y.o. year old female  has a past medical history of Allergy, Anxiety, Arthritis, Asthma, Back pain, Common migraine with intractable migraine (01/26/2021), Depression, Ectopic pregnancy without intrauterine pregnancy (2013), Family history of adverse reaction to anesthesia, GERD (gastroesophageal reflux disease), Headache(784.0), History of kidney stones, Hypertension, Joint pain, Lactose intolerance, SVD (spontaneous vaginal delivery) (02/25/2014), and Vitamin D deficiency. here with:  1.  Common migraine headache 2.  Mild papilledema  -MRI of the brain was normal -LP showed opening pressure 22, before starting Topamax -Migraines have resolved with Topamax 75 mg at bedtime -Last 4-6 weeks return of lightheadedness, woozy feeling present before LP  -Will increase Topamax 100 mg at bedtime, if she tolerates this, and it is helpful, can further increase dosing, hold off on repeating LP at this time -Reportedly saw ophthalmology in March, exam  was normal, will send back if higher Topamax isn't helpful  -I encouraged her to stay in close contact, follow-up in 3 to 4 months or sooner if needed  I spent 32 minutes of face-to-face and non-face-to-face time with patient.  This included previsit chart review, lab review, study review, discussing Topamax, and management plan going forward.   Margie EgeSarah Krishika Bugge, AGNP-C, DNP 05/03/2021, 4:01 PM [facility] [address], [address] [place], [place] [address] [phone]
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
## 2021-08-05 NOTE — Progress Notes (Deleted)
No chief complaint on file.    HISTORY OF PRESENT ILLNESS: 08/05/21 ALL:  Diane [name] is a 35 y.o. female here today for follow up for migraines. Topiramate was increased to 100mg  at last visit 04/2021.    05/03/21 SS: Ms. Diane [name] is a 35 year old female with history of migraine headache.  She was referred to this office for mild papilledema.  MRI of the brain was normal. LP showed opening pressure 22, does not meet criteria for pseudotumor cerebri.  She was started on Topamax.  Currently taking Topamax 75 mg at bedtime.  She no longer complains of any headache.  LP was in February.  She saw her ophthalmologist in March, reports there was no swelling to the optic nerves.  For the last 4 to 6 weeks, reports slow onset of recurrent symptoms including lightheaded sensation when bending over, feeling woozy when rising.  Intermittent visual disturbance that is blurry, or tunnel vision.  The symptoms were present prior to LP, but nothing as bad as before.  Symptoms all came on suddenly in January 2022.  Since seen in February, she has lost 8 pounds.  She works as a Architectural technologistteacher's assistant.  Here today for evaluation unaccompanied.   HISTORY 01/26/2021 Dr. Anne HahnWillis: Ms. Diane [name] is a 35 year old right-handed black female with a history of migraine headaches since she was 35 years old.  She indicates that the headaches began when she started her menstrual cycle.  The headaches have always been worse around the menstrual cycle, but the severity the headaches clearly worsened after delivery of her first child several years ago.  The patient indicates that treatment with birth control pills and estrogen patches seemed to markedly reduce the frequency of her headaches, 6 months ago she was only having 2 or 3 headache days a month.  Within the last 2 to 3 weeks, she has had headaches that converted to daily in nature.  The headaches usually start in the left frontotemporal area, occasionally on the right.  They  will spread backwards to the back of the head associated with photophobia, phonophobia, and nausea and vomiting.  The patient does not note any particular activating factors other than the menstrual cycle that brings on a headache.  The patient went off of her birth control pills in December 2021 after she had a tubal ligation.  The headaches have significantly worsened off the birth control.  The patient reports some blurring of vision with the headache and decreased concentration.  She denies any focal numbness or weakness or aphasia associated with the headache.  She drinks 2 Mountain Dew beverages daily, no other caffeinated drinks.  She works as a Geologist, engineeringteacher assistant, she has not been able to work over the last week.  She takes 25 mg Imitrex and she has been on Depakote for about a year, she only takes 250 mg daily.  She has noted weight gain and hair loss on the medication.  She denies any family history of migraine.  The patient had a recent eye examination and there was some question of mild papilledema, she is sent to this office for an evaluation.   REVIEW OF SYSTEMS: Out of a complete 14 system review of symptoms, the patient complains only of the following symptoms, and all other reviewed systems are negative.   ALLERGIES: No Known Allergies   HOME MEDICATIONS: Outpatient Medications Prior to Visit  Medication Sig Dispense Refill   AZO-CRANBERRY PO Take 2 tablets by mouth daily.  B Complex-Biotin-FA (SUPER B-50 B COMPLEX PO) Take by mouth daily.     buPROPion (WELLBUTRIN) 100 MG tablet Take 100 mg by mouth daily.     chlorhexidine (PERIDEX) 0.12 % solution SMARTSIG:By Mouth     escitalopram (LEXAPRO) 20 MG tablet Take 20 mg by mouth at bedtime.      fluticasone (FLONASE) 50 MCG/ACT nasal spray Place 2 sprays into both nostrils daily. (Patient taking differently: Place 2 sprays into both nostrils as needed.) 16 g 6   hydrochlorothiazide (HYDRODIURIL) 25 MG tablet Take 25 mg by mouth at  bedtime.     hydrOXYzine (ATARAX/VISTARIL) 25 MG tablet Take 25 mg by mouth every 4 (four) hours as needed. Takes 1 tab qhs     ibuprofen (ADVIL) 200 MG tablet Take 200 mg by mouth every 6 (six) hours as needed.     loratadine (CLARITIN) 10 MG tablet Take 10 mg by mouth at bedtime.      montelukast (SINGULAIR) 10 MG tablet Take 10 mg by mouth at bedtime.     phentermine 30 MG capsule Take 30 mg by mouth daily.     promethazine (PHENERGAN) 12.5 MG tablet Take 12.5 mg by mouth every 8 (eight) hours as needed.     SUMAtriptan (IMITREX) 100 MG tablet Take 1 tablet (100 mg total) by mouth 2 (two) times daily as needed for migraine. 10 tablet 3   topiramate (TOPAMAX) 100 MG tablet Take 1 tablet (100 mg total) by mouth at bedtime. 90 tablet 1   UNABLE TO FIND Probiotic 1 daily     No facility-administered medications prior to visit.     PAST MEDICAL HISTORY: Past Medical History:  Diagnosis Date   Allergy    seasonal allergies   Anxiety    Arthritis    knees bilaterally   Asthma    allergy induced asthma, no inhaler used   Back pain    Common migraine with intractable migraine 01/26/2021   Depression    Ectopic pregnancy without intrauterine pregnancy 2013   Family history of adverse reaction to anesthesia    both grandmother slow to awaken after anesthesia   GERD (gastroesophageal reflux disease)    Headache(784.0)    migraines   History of kidney stones    Hypertension    Joint pain    Lactose intolerance    SVD (spontaneous vaginal delivery) 02/25/2014   Vitamin D deficiency      PAST SURGICAL HISTORY: Past Surgical History:  Procedure Laterality Date   DILATION AND CURETTAGE OF UTERUS  11/18/2012   Procedure: DILATATION AND CURETTAGE;  Surgeon: Serita Kyle, MD;  Location: [facility];  Service: Gynecology;  Laterality: N/A;   falopian tube removed  December 2013   had a tubal pregnancy   LAPAROSCOPIC TUBAL LIGATION Right 11/16/2020   Procedure: LAPAROSCOPIC Right TUBAL  LIGATION By Salpingectomy;  Surgeon: Toy Baker, DO;  Location: [facility];  Service: Gynecology;  Laterality: Right;   LAPAROSCOPY  11/18/2012   Procedure: LAPAROSCOPY OPERATIVE;  Surgeon: Serita Kyle, MD;  Location: [facility];  Service: Gynecology;  Laterality: N/A;  Operative Laparoscopy with removal of left fallopian tube and ectopic pregnancy, Dilation and evacuation     FAMILY HISTORY: Family History  Problem Relation Age of Onset   Asthma Mother    Obesity Mother    Hypertension Mother    Diabetes Father    Hypertension Father    Asthma Brother    Asthma Daughter  Cancer Maternal Grandmother        breast   Diabetes Paternal Grandmother    Hypertension Paternal Grandmother    Diabetes Paternal Grandfather    Hypertension Paternal Grandfather    Hearing loss Paternal Grandfather        work related   Other Neg Hx      SOCIAL HISTORY: Social History   Socioeconomic History   Marital status: Married    Spouse name: Marzettis Hughes Better   Number of children: 2   Years of education: Not on file   Highest education level: Bachelor's degree (e.g., BA, AB, BS)  Occupational History   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    Employer: [employer]  Tobacco Use   Smoking status: Every Day    Packs/day: 0.50    Years: 10.00    Pack years: 5.00    Types: Cigarettes   Smokeless tobacco: Never  Vaping Use   Vaping Use: Never used  Substance and Sexual Activity   Alcohol use: Yes    Comment: occ   Drug use: Never   Sexual activity: Yes  Other Topics Concern   Not on file  Social History Narrative   Lives with family   Caffeine- 2 + c daily   Social Determinants of Health   Financial Resource Strain: Not on file  Food Insecurity: Not on file  Transportation Needs: Not on file  Physical Activity: Not on file  Stress: Not on file  Social Connections: Not on file  Intimate Partner Violence: Not on file     PHYSICAL  EXAM  There were no vitals filed for this visit. There is no height or weight on file to calculate BMI.   Generalized: Well developed, in no acute distress  Cardiology: normal rate and rhythm, no murmur auscultated  Respiratory: clear to auscultation bilaterally    Neurological examination  Mentation: Alert oriented to time, place, history taking. Follows all commands speech and language fluent Cranial nerve II-XII: Pupils were equal round reactive to light. Extraocular movements were full, visual field were full on confrontational test. Facial sensation and strength were normal. Uvula tongue midline. Head turning and shoulder shrug  were normal and symmetric. Motor: The motor testing reveals 5 over 5 strength of all 4 extremities. Good symmetric motor tone is noted throughout.  Sensory: Sensory testing is intact to soft touch on all 4 extremities. No evidence of extinction is noted.  Coordination: Cerebellar testing reveals good finger-nose-finger and heel-to-shin bilaterally.  Gait and station: Gait is normal. Tandem gait is normal. Romberg is negative. No drift is seen.  Reflexes: Deep tendon reflexes are symmetric and normal bilaterally.    DIAGNOSTIC DATA (LABS, IMAGING, TESTING) - I reviewed patient records, labs, notes, testing and imaging myself where available.  Lab Results  Component Value Date   WBC 11.8 (H) 11/16/2020   HGB 13.9 11/16/2020   HCT 41.0 11/16/2020   MCV 82.9 11/16/2020   PLT 327 11/16/2020      Component Value Date/Time   NA 138 11/16/2020 1240   NA 140 01/17/2019 0850   K 3.4 (L) 11/16/2020 1240   CL 100 11/16/2020 1240   CO2 21 01/17/2019 0850   GLUCOSE 88 11/16/2020 1240   BUN 6 11/16/2020 1240   BUN 8 01/17/2019 0850   CREATININE 0.80 11/16/2020 1240   CALCIUM 8.8 01/17/2019 0850   PROT 6.8 01/17/2019 0850   ALBUMIN 4.0 01/17/2019 0850   AST 8 01/17/2019 0850  ALT 9 01/17/2019 0850   ALKPHOS 54 01/17/2019 0850   BILITOT 0.2 01/17/2019  0850   GFRNONAA 72 01/17/2019 0850   GFRAA 83 01/17/2019 0850   Lab Results  Component Value Date   CHOL 199 01/17/2019   HDL 76 01/17/2019   LDLCALC 101 (H) 01/17/2019   TRIG 108 01/17/2019   Lab Results  Component Value Date   HGBA1C 5.2 01/17/2019   Lab Results  Component Value Date   VITAMINB12 645 09/10/2018   Lab Results  Component Value Date   TSH 5.580 (H) 01/17/2019    No flowsheet data found.   No flowsheet data found.   ASSESSMENT AND PLAN  35 y.o. year old female  has a past medical history of Allergy, Anxiety, Arthritis, Asthma, Back pain, Common migraine with intractable migraine (01/26/2021), Depression, Ectopic pregnancy without intrauterine pregnancy (2013), Family history of adverse reaction to anesthesia, GERD (gastroesophageal reflux disease), Headache(784.0), History of kidney stones, Hypertension, Joint pain, Lactose intolerance, SVD (spontaneous vaginal delivery) (02/25/2014), and Vitamin D deficiency. here with    No diagnosis found.   No orders of the defined types were placed in this encounter.    No orders of the defined types were placed in this encounter.     Shawnie Dapper, MSN, FNP-C 08/05/2021, 1:05 PM  [facility] [address] [place], [place] [address] [phone]

## 2021-08-09 ENCOUNTER — Ambulatory Visit: Payer: Self-pay | Admitting: Family Medicine

## 2021-08-09 ENCOUNTER — Telehealth: Payer: Self-pay | Admitting: Family Medicine

## 2021-08-09 DIAGNOSIS — G43019 Migraine without aura, intractable, without status migrainosus: Secondary | ICD-10-CM

## 2021-08-09 NOTE — Telephone Encounter (Signed)
Pt called had to cancel her appt for today 08/09/2021 due to not having any ins.

## 2022-01-13 ENCOUNTER — Ambulatory Visit
Admission: EM | Admit: 2022-01-13 | Discharge: 2022-01-13 | Disposition: A | Discharge status: Home / Self Care | Payer: 59 | Attending: Internal Medicine | Admitting: Internal Medicine

## 2022-01-13 ENCOUNTER — Encounter: Payer: Self-pay | Admitting: Emergency Medicine

## 2022-01-13 ENCOUNTER — Other Ambulatory Visit: Payer: Self-pay

## 2022-01-13 DIAGNOSIS — J069 Acute upper respiratory infection, unspecified: Secondary | ICD-10-CM

## 2022-01-13 MED ORDER — BENZONATATE 100 MG PO CAPS: 100.0000 mg | ORAL_CAPSULE | Freq: Three times a day (TID) | ORAL | 0 refills | Status: AC | PRN

## 2022-01-13 NOTE — Discharge Instructions (Signed)
It appears that you have a viral upper respiratory infection that should resolve on its own in the next few days.  Rapid flu was negative.  Covid 19 PCR is pending.  You have been sent a cough medication to take as needed.

## 2022-01-13 NOTE — ED Triage Notes (Signed)
Nasal congestion, cough, fatigue, headache, body aches starting yesterday.

## 2022-01-13 NOTE — ED Provider Notes (Signed)
[facility]    CSN: [id number] Arrival date & time: 01/13/22  1548      History   Chief Complaint Chief Complaint  Patient presents with   Cough   Generalized Body Aches    HPI Diane [name] is a 36 y.o. female.   Patient presents with nasal congestion, nonproductive cough, fatigue, headache, body aches that started yesterday.  Her family members that live in the same household have similar symptoms currently.  Patient denies any known fevers.  Denies sore throat, ear pain, chest pain, shortness of breath, nausea, vomiting, diarrhea, abdominal pain.  Patient has taken over-the-counter cold and flu medication with minimal improvement in symptoms.   Cough  Past Medical History:  Diagnosis Date   Allergy    seasonal allergies   Anxiety    Arthritis    knees bilaterally   Asthma    allergy induced asthma, no inhaler used   Back pain    Common migraine with intractable migraine 01/26/2021   Depression    Ectopic pregnancy without intrauterine pregnancy 2013   Family history of adverse reaction to anesthesia    both grandmother slow to awaken after anesthesia   GERD (gastroesophageal reflux disease)    Headache(784.0)    migraines   History of kidney stones    Hypertension    Joint pain    Lactose intolerance    SVD (spontaneous vaginal delivery) 02/25/2014   Vitamin D deficiency     Patient Active Problem List   Diagnosis Date Noted   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   Common migraine with intractable migraine 01/26/2021   SVD (spontaneous vaginal delivery) 02/25/2014   Postpartum care following vaginal delivery (02/24/14) 02/24/2014   Ectopic pregnancy 11/18/2012    Past Surgical History:  Procedure Laterality Date   DILATION AND CURETTAGE OF UTERUS  11/18/2012   Procedure: DILATATION AND CURETTAGE;  Surgeon: Serita Kyle, MD;  Location: [facility];  Service: Gynecology;  Laterality: N/A;   falopian tube removed  December 2013   had a tubal  pregnancy   LAPAROSCOPIC TUBAL LIGATION Right 11/16/2020   Procedure: LAPAROSCOPIC Right TUBAL LIGATION By Salpingectomy;  Surgeon: Toy Baker, DO;  [facility];  Service: Gynecology;  Laterality: Right;   LAPAROSCOPY  11/18/2012   Procedure: LAPAROSCOPY OPERATIVE;  Surgeon: Serita Kyle, MD;  Location: [facility];  Service: Gynecology;  Laterality: N/A;  Operative Laparoscopy with removal of left fallopian tube and ectopic pregnancy, Dilation and evacuation    OB History     Gravida  3   Para  2   Term  2   Preterm      AB  1   Living  2      SAB      IAB      Ectopic  1   Multiple      Live Births  2            Home Medications    Prior to Admission medications   Medication Sig Start Date End Date Taking? Authorizing Provider  benzonatate (TESSALON) 100 MG capsule Take 1 capsule (100 mg total) by mouth every 8 (eight) hours as needed for cough. 01/13/22  Yes , Rolly Salter E, FNP  AZO-CRANBERRY PO Take 2 tablets by mouth daily.    [provider]  B Complex-Biotin-FA (SUPER B-50 B COMPLEX PO) Take by mouth daily.    [provider]  buPROPion (WELLBUTRIN) 100 MG tablet Take 100 mg by  mouth daily. 01/18/21   [provider]  chlorhexidine (PERIDEX) 0.12 % solution SMARTSIG:By Mouth 04/07/21   [provider]  escitalopram (LEXAPRO) 20 MG tablet Take 20 mg by mouth at bedtime.     [provider]  fluticasone (FLONASE) 50 MCG/ACT nasal spray Place 2 sprays into both nostrils daily. Patient taking differently: Place 2 sprays into both nostrils as needed. 08/19/16   Shade FloodGreene, Jeffrey R, MD  hydrochlorothiazide (HYDRODIURIL) 25 MG tablet Take 25 mg by mouth at bedtime.    [provider]  hydrOXYzine (ATARAX/VISTARIL) 25 MG tablet Take 25 mg by mouth every 4 (four) hours as needed. Takes 1 tab qhs    [provider]  ibuprofen (ADVIL) 200 MG tablet Take 200 mg by mouth every 6 (six)  hours as needed.    [provider]  loratadine (CLARITIN) 10 MG tablet Take 10 mg by mouth at bedtime.     [provider]  montelukast (SINGULAIR) 10 MG tablet Take 10 mg by mouth at bedtime.    [provider]  phentermine 30 MG capsule Take 30 mg by mouth daily. 01/13/21   [provider]  promethazine (PHENERGAN) 12.5 MG tablet Take 12.5 mg by mouth every 8 (eight) hours as needed. 01/21/21   [provider]  SUMAtriptan (IMITREX) 100 MG tablet Take 1 tablet (100 mg total) by mouth 2 (two) times daily as needed for migraine. 01/26/21   York SpanielWillis, Charles K, MD  topiramate (TOPAMAX) 100 MG tablet Take 1 tablet (100 mg total) by mouth at bedtime. 05/03/21   Glean SalvoSlack, Sarah J, NP  UNABLE TO FIND Probiotic 1 daily    [provider]    Family History Family History  Problem Relation Age of Onset   Asthma Mother    Obesity Mother    Hypertension Mother    Diabetes Father    Hypertension Father    Asthma Brother    Asthma Daughter    Cancer Maternal Grandmother        breast   Diabetes Paternal Grandmother    Hypertension Paternal Grandmother    Diabetes Paternal Grandfather    Hypertension Paternal Grandfather    Hearing loss Paternal Grandfather        work related   Other Neg Hx     Social History Social History   Tobacco Use   Smoking status: Every Day    Packs/day: 0.50    Years: 10.00    Pack years: 5.00    Types: Cigarettes   Smokeless tobacco: Never  Vaping Use   Vaping Use: Never used  Substance Use Topics   Alcohol use: Yes    Comment: occ   Drug use: Never     Allergies   Patient has no known allergies.   Review of Systems Review of Systems Per HPI  Physical Exam Triage Vital Signs ED Triage Vitals  Enc Vitals Group     BP 01/13/22 1559 122/80     Pulse Rate 01/13/22 1559 (!) 107     Resp 01/13/22 1559 16     Temp 01/13/22 1559 98.3 F (36.8 C)     Temp Source 01/13/22 1559 Oral     SpO2 01/13/22  1559 99 %     Weight --      Height --      Head Circumference --      Peak Flow --      Pain Score 01/13/22 1602 4     Pain  Loc --      Pain Edu? --      Excl. in GC? --    No data found.  Updated Vital Signs BP 122/80 (BP Location: Left Arm)    Pulse (!) 107    Temp 98.3 F (36.8 C) (Oral)    Resp 16    SpO2 99%   Visual Acuity Right Eye Distance:   Left Eye Distance:   Bilateral Distance:    Right Eye Near:   Left Eye Near:    Bilateral Near:     Physical Exam Constitutional:      General: She is not in acute distress.    Appearance: Normal appearance. She is not toxic-appearing or diaphoretic.  HENT:     Head: Normocephalic and atraumatic.     Right Ear: Tympanic membrane and ear canal normal.     Left Ear: Tympanic membrane and ear canal normal.     Nose: Congestion present.     Mouth/Throat:     Mouth: Mucous membranes are moist.     Pharynx: No posterior oropharyngeal erythema.  Eyes:     Extraocular Movements: Extraocular movements intact.     Conjunctiva/sclera: Conjunctivae normal.     Pupils: Pupils are equal, round, and reactive to light.  Cardiovascular:     Rate and Rhythm: Normal rate and regular rhythm.     Pulses: Normal pulses.     Heart sounds: Normal heart sounds.  Pulmonary:     Effort: Pulmonary effort is normal. No respiratory distress.     Breath sounds: Normal breath sounds. No stridor. No wheezing, rhonchi or rales.  Abdominal:     General: Abdomen is flat. Bowel sounds are normal.     Palpations: Abdomen is soft.  Musculoskeletal:        General: Normal range of motion.     Cervical back: Normal range of motion.  Skin:    General: Skin is warm and dry.  Neurological:     General: No focal deficit present.     Mental Status: She is alert and oriented to person, place, and time. Mental status is at baseline.  Psychiatric:        Mood and Affect: Mood normal.        Behavior: Behavior normal.     UC Treatments / Results   Labs (all labs ordered are listed, but only abnormal results are displayed) Labs Reviewed  NOVEL CORONAVIRUS, NAA  POCT INFLUENZA A/B    EKG   Radiology No results found.  Procedures Procedures (including critical care time)  Medications Ordered in UC Medications - No data to display  Initial Impression / Assessment and Plan / UC Course  I have reviewed the triage vital signs and the nursing notes.  Pertinent labs & imaging results that were available during my care of the patient were reviewed by me and considered in my medical decision making (see chart for details).     Patient presents with symptoms likely from a viral upper respiratory infection. Differential includes bacterial pneumonia, sinusitis, allergic rhinitis, COVID-19, flu. Do not suspect underlying cardiopulmonary process. Symptoms seem unlikely related to ACS, CHF or COPD exacerbations, pneumonia, pneumothorax. Patient is nontoxic appearing and not in need of emergent medical intervention.  Flu was negative.  COVID-19 PCR pending.  Recommended symptom control with over the counter medications.  Patient sent cough medication.  Return if symptoms fail to improve in 1-2 weeks or you develop shortness of breath, chest pain, severe headache. Patient  states understanding and is agreeable.  Discharged with PCP followup.  Final Clinical Impressions(s) / UC Diagnoses   Final diagnoses:  Viral upper respiratory tract infection with cough     Discharge Instructions      It appears that you have a viral upper respiratory infection that should resolve on its own in the next few days.  Rapid flu was negative.  Covid 19 PCR is pending.  You have been sent a cough medication to take as needed.     ED Prescriptions     Medication Sig Dispense Auth. Provider   benzonatate (TESSALON) 100 MG capsule Take 1 capsule (100 mg total) by mouth every 8 (eight) hours as needed for cough. 21 capsule [place], Acie FredricksonHaley E, [place]NP       PDMP not reviewed this encounter.   Gustavus BryantMound, Coltyn Hanning E, [place]NP 01/13/22 [phone]

## 2022-01-14 LAB — NOVEL CORONAVIRUS, NAA: SARS-CoV-2, NAA: DETECTED — AB

## 2022-01-14 LAB — SARS-COV-2, NAA 2 DAY TAT

## 2022-01-18 ENCOUNTER — Other Ambulatory Visit: Payer: Self-pay

## 2022-01-18 ENCOUNTER — Encounter: Payer: Self-pay | Admitting: Emergency Medicine

## 2022-01-18 ENCOUNTER — Ambulatory Visit
Admission: EM | Admit: 2022-01-18 | Discharge: 2022-01-18 | Disposition: A | Discharge status: Home / Self Care | Payer: 59 | Attending: Internal Medicine | Admitting: Internal Medicine

## 2022-01-18 DIAGNOSIS — U071 COVID-19: Secondary | ICD-10-CM

## 2022-01-18 DIAGNOSIS — R0602 Shortness of breath: Secondary | ICD-10-CM | POA: Diagnosis not present

## 2022-01-18 MED ORDER — PREDNISONE 20 MG PO TABS: 40.0000 mg | ORAL_TABLET | Freq: Every day | ORAL | 0 refills | Status: AC

## 2022-01-18 MED ORDER — ALBUTEROL SULFATE HFA 108 (90 BASE) MCG/ACT IN AERS: 1.0000 | INHALATION_SPRAY | Freq: Four times a day (QID) | RESPIRATORY_TRACT | 0 refills | Status: AC | PRN

## 2022-01-18 NOTE — ED Triage Notes (Signed)
Patient states she tested positive for COVID on Friday, Sunday night, she has been feeling like bricks has been sitting on her chest, SOB, lightheaded when up moving around.  Patient has been taken Mucinex, Benzotate.

## 2022-01-18 NOTE — ED Provider Notes (Signed)
[facility]    CSN: [id number] Arrival date & time: 01/18/22  1059      History   Chief Complaint Chief Complaint  Patient presents with   Tested positive for COVID on Friday    HPI Diane [name] is a 36 y.o. female.   Patient presents for further evaluation of shortness of breath and chest tightness after testing positive for COVID-19.  Patient was previously seen in urgent care on 01/13/2022 and had a send out PCR test that was positive for COVID-19.  Symptoms started approximately 6 to 7 days ago.  She reports that she started having shortness of breath and chest tightness approximately 3 days ago.  She has also has some associated dizziness.  She reports that she does have history of asthma that only flares up when she has an acute illness.  Does not use albuterol inhaler.  Patient has been taking Tessalon Perles and Mucinex with minimal improvement.  Denies any known fevers.    Past Medical History:  Diagnosis Date   Allergy    seasonal allergies   Anxiety    Arthritis    knees bilaterally   Asthma    allergy induced asthma, no inhaler used   Back pain    Common migraine with intractable migraine 01/26/2021   Depression    Ectopic pregnancy without intrauterine pregnancy 2013   Family history of adverse reaction to anesthesia    both grandmother slow to awaken after anesthesia   GERD (gastroesophageal reflux disease)    Headache(784.0)    migraines   History of kidney stones    Hypertension    Joint pain    Lactose intolerance    SVD (spontaneous vaginal delivery) 02/25/2014   Vitamin D deficiency     Patient Active Problem List   Diagnosis Date Noted   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   Common migraine with intractable migraine 01/26/2021   SVD (spontaneous vaginal delivery) 02/25/2014   Postpartum care following vaginal delivery (02/24/14) 02/24/2014   Ectopic pregnancy 11/18/2012    Past Surgical History:  Procedure Laterality Date   DILATION AND  CURETTAGE OF UTERUS  11/18/2012   Procedure: DILATATION AND CURETTAGE;  Surgeon: Serita Kyle, MD;  Location: [facility];  Service: Gynecology;  Laterality: N/A;   falopian tube removed  December 2013   had a tubal pregnancy   LAPAROSCOPIC TUBAL LIGATION Right 11/16/2020   Procedure: LAPAROSCOPIC Right TUBAL LIGATION By Salpingectomy;  Surgeon: Toy Baker, DO;  Location: [facility];  Service: Gynecology;  Laterality: Right;   LAPAROSCOPY  11/18/2012   Procedure: LAPAROSCOPY OPERATIVE;  Surgeon: Serita Kyle, MD;  Location: [facility];  Service: Gynecology;  Laterality: N/A;  Operative Laparoscopy with removal of left fallopian tube and ectopic pregnancy, Dilation and evacuation    OB History     Gravida  3   Para  2   Term  2   Preterm      AB  1   Living  2      SAB      IAB      Ectopic  1   Multiple      Live Births  2            Home Medications    Prior to Admission medications   Medication Sig Start Date End Date Taking? Authorizing Provider  albuterol (VENTOLIN HFA) 108 (90 Base) MCG/ACT inhaler Inhale 1-2 puffs into the lungs every 6 (six) hours as needed  for wheezing or shortness of breath. 01/18/22  Yes , Rolly Salter E, FNP  AZO-CRANBERRY PO Take 2 tablets by mouth daily.   Yes [provider]  B Complex-Biotin-FA (SUPER B-50 B COMPLEX PO) Take by mouth daily.   Yes [provider]  benzonatate (TESSALON) 100 MG capsule Take 1 capsule (100 mg total) by mouth every 8 (eight) hours as needed for cough. 01/13/22  Yes , Rolly Salter E, FNP  buPROPion (WELLBUTRIN) 100 MG tablet Take 100 mg by mouth daily. 01/18/21  Yes [provider]  chlorhexidine (PERIDEX) 0.12 % solution SMARTSIG:By Mouth 04/07/21  Yes [provider]  escitalopram (LEXAPRO) 20 MG tablet Take 20 mg by mouth at bedtime.    Yes [provider]  fluticasone (FLONASE) 50 MCG/ACT nasal spray Place 2 sprays into both nostrils  daily. Patient taking differently: Place 2 sprays into both nostrils as needed. 08/19/16  Yes Shade Flood, MD  hydrochlorothiazide (HYDRODIURIL) 25 MG tablet Take 25 mg by mouth at bedtime.   Yes [provider]  hydrOXYzine (ATARAX/VISTARIL) 25 MG tablet Take 25 mg by mouth every 4 (four) hours as needed. Takes 1 tab qhs   Yes [provider]  ibuprofen (ADVIL) 200 MG tablet Take 200 mg by mouth every 6 (six) hours as needed.   Yes [provider]  loratadine (CLARITIN) 10 MG tablet Take 10 mg by mouth at bedtime.    Yes [provider]  montelukast (SINGULAIR) 10 MG tablet Take 10 mg by mouth at bedtime.   Yes [provider]  phentermine 30 MG capsule Take 30 mg by mouth daily. 01/13/21  Yes [provider]  predniSONE (DELTASONE) 20 MG tablet Take 2 tablets (40 mg total) by mouth daily for 5 days. 01/18/22 01/23/22 Yes , Acie Fredrickson, FNP  promethazine (PHENERGAN) 12.5 MG tablet Take 12.5 mg by mouth every 8 (eight) hours as needed. 01/21/21  Yes [provider]  SUMAtriptan (IMITREX) 100 MG tablet Take 1 tablet (100 mg total) by mouth 2 (two) times daily as needed for migraine. 01/26/21  Yes York Spaniel, MD  topiramate (TOPAMAX) 100 MG tablet Take 1 tablet (100 mg total) by mouth at bedtime. 05/03/21  Yes Glean Salvo, NP  UNABLE TO FIND Probiotic 1 daily   Yes [provider]    Family History Family History  Problem Relation Age of Onset   Asthma Mother    Obesity Mother    Hypertension Mother    Diabetes Father    Hypertension Father    Asthma Brother    Asthma Daughter    Cancer Maternal Grandmother        breast   Diabetes Paternal Grandmother    Hypertension Paternal Grandmother    Diabetes Paternal Grandfather    Hypertension Paternal Grandfather    Hearing loss Paternal Grandfather        work related   Other Neg Hx     Social History Social History   Tobacco Use   Smoking status: Every  Day    Packs/day: 0.50    Years: 10.00    Pack years: 5.00    Types: Cigarettes   Smokeless tobacco: Never  Vaping Use   Vaping Use: Never used  Substance Use Topics   Alcohol use: Yes    Comment: occ   Drug use: Never     Allergies   Patient has no known allergies.   Review of Systems Review of Systems Per HPI  Physical  Exam Triage Vital Signs ED Triage Vitals  Enc Vitals Group     BP 01/18/22 1154 111/79     Pulse Rate 01/18/22 1154 (!) 102     Resp 01/18/22 1154 20     Temp 01/18/22 1154 98.8 F (37.1 C)     Temp Source 01/18/22 1154 Oral     SpO2 01/18/22 1154 97 %     Weight 01/18/22 1156 192 lb 0.3 oz (87.1 kg)     Height 01/18/22 1156 5\' 3"  (1.6 m)     Head Circumference --      Peak Flow --      Pain Score 01/18/22 1155 4     Pain Loc --      Pain Edu? --      Excl. in GC? --    No data found.  Updated Vital Signs BP 111/79 (BP Location: Left Arm)    Pulse (!) 102    Temp 98.8 F (37.1 C) (Oral)    Resp 20    Ht 5\' 3"  (1.6 m)    Wt 192 lb 0.3 oz (87.1 kg)    SpO2 97%    BMI 34.01 kg/m   Visual Acuity Right Eye Distance:   Left Eye Distance:   Bilateral Distance:    Right Eye Near:   Left Eye Near:    Bilateral Near:     Physical Exam Constitutional:      General: She is not in acute distress.    Appearance: Normal appearance. She is not toxic-appearing or diaphoretic.  HENT:     Head: Normocephalic and atraumatic.     Right Ear: Tympanic membrane and ear canal normal.     Left Ear: Tympanic membrane and ear canal normal.     Nose: Congestion present.     Mouth/Throat:     Mouth: Mucous membranes are moist.     Pharynx: No posterior oropharyngeal erythema.  Eyes:     Extraocular Movements: Extraocular movements intact.     Conjunctiva/sclera: Conjunctivae normal.     Pupils: Pupils are equal, round, and reactive to light.  Cardiovascular:     Rate and Rhythm: Normal rate and regular rhythm.     Pulses: Normal pulses.     Heart  sounds: Normal heart sounds.  Pulmonary:     Effort: Pulmonary effort is normal. No respiratory distress.     Breath sounds: Normal breath sounds. No stridor. No wheezing, rhonchi or rales.  Abdominal:     General: Abdomen is flat. Bowel sounds are normal.     Palpations: Abdomen is soft.  Musculoskeletal:        General: Normal range of motion.     Cervical back: Normal range of motion.  Skin:    General: Skin is warm and dry.  Neurological:     General: No focal deficit present.     Mental Status: She is alert and oriented to person, place, and time. Mental status is at baseline.  Psychiatric:        Mood and Affect: Mood normal.        Behavior: Behavior normal.     UC Treatments / Results  Labs (all labs ordered are listed, but only abnormal results are displayed) Labs Reviewed - No data to display  EKG   Radiology No results found.  Procedures Procedures (including critical care time)  Medications Ordered in UC Medications - No data to display  Initial Impression / Assessment and Plan / UC Course  I have reviewed the triage vital signs and the nursing notes.  Pertinent labs & imaging results that were available during my care of the patient were reviewed by me and considered in my medical decision making (see chart for details).     There is suspicion that patient could be having an asthma exacerbation given patient's history of asthma and current COVID-19 viral diagnosis.  Will treat with prednisone and albuterol inhaler.  Suggested chest x-ray but patient declined.  Risks associated with not doing x-ray were discussed with patient.  Patient voiced understanding.  Discussed strict return and ER precautions.  Patient verbalized understanding and was agreeable with plan. Final Clinical Impressions(s) / UC Diagnoses   Final diagnoses:  COVID-19  Shortness of breath     Discharge Instructions      You have been prescribed prednisone steroid and albuterol  inhaler to take for shortness of breath.  Please go the hospital if symptoms persist or worsen.    ED Prescriptions     Medication Sig Dispense Auth. Provider   albuterol (VENTOLIN HFA) 108 (90 Base) MCG/ACT inhaler Inhale 1-2 puffs into the lungs every 6 (six) hours as needed for wheezing or shortness of breath. 1 each [place], Rolly SalterHaley E, [place]   predniSONE (DELTASONE) 20 MG tablet Take 2 tablets (40 mg total) by mouth daily for 5 days. 10 tablet Gustavus BryantMound, Amun Stemm E, [place]NP      PDMP not reviewed this encounter.   Gustavus BryantMound, Tuff Clabo E, [place]FNP 01/18/22 1249

## 2022-01-18 NOTE — Discharge Instructions (Signed)
You have been prescribed prednisone steroid and albuterol inhaler to take for shortness of breath.  Please go the hospital if symptoms persist or worsen.

## 2022-02-07 ENCOUNTER — Other Ambulatory Visit: Payer: Self-pay

## 2022-02-07 MED ORDER — TOPIRAMATE 100 MG PO TABS: 100.0000 mg | ORAL_TABLET | Freq: Every day | ORAL | 0 refills | Status: AC

## 2022-03-06 ENCOUNTER — Encounter: Payer: Self-pay | Admitting: Emergency Medicine

## 2022-03-06 ENCOUNTER — Other Ambulatory Visit: Payer: Self-pay

## 2022-03-06 ENCOUNTER — Ambulatory Visit
Admission: EM | Admit: 2022-03-06 | Discharge: 2022-03-06 | Disposition: A | Discharge status: Home / Self Care | Payer: 59

## 2022-03-06 DIAGNOSIS — K047 Periapical abscess without sinus: Secondary | ICD-10-CM

## 2022-03-06 MED ORDER — IBUPROFEN 800 MG PO TABS: 800.0000 mg | ORAL_TABLET | Freq: Three times a day (TID) | ORAL | 0 refills | Status: DC | PRN

## 2022-03-06 MED ORDER — FLUCONAZOLE 150 MG PO TABS: ORAL_TABLET | ORAL | 2 refills | Status: AC

## 2022-03-06 MED ORDER — PENICILLIN V POTASSIUM 500 MG PO TABS: 500.0000 mg | ORAL_TABLET | Freq: Three times a day (TID) | ORAL | 0 refills | Status: AC

## 2022-03-06 NOTE — Discharge Instructions (Addendum)
Please begin penicillin 1 tablet 3 times daily to address any infection in the teeth in your lower right jaw.  After a few days you have significant improvement of your swelling and tenderness in the area.  Also continue taking ibuprofen 3 times daily as needed for pain, I provided you with a renewal of that prescription.  I also provided you with a prescription for Diflucan for the inevitable vaginal yeast infection caused by prolonged use of antibiotics.  There are refills for your convenience. ? ?Thank you for visiting urgent care today.  Please be sure to reach out to your dentist as soon as possible to avoid any complications of your surrounding teeth. ?

## 2022-03-06 NOTE — ED Provider Notes (Signed)
?[facility] ? ? ? ?CSN: [id number] ?Arrival date & time: 03/06/22  1517 ?  ? ?HISTORY  ? ?Chief Complaint  ?Patient presents with  ? Dental Pain  ? ?HPI ?Diane [name] is a 36 y.o. female. Patient c/o left sided dental pain, swelling, sharp pain x 2 days.  Unable to eat on that side.  Patient taken Ibuprofen 800mg  q6 hours with little relief of pain.  Patient states she is had dental abscesses in the past but never in this particular tooth.  Patient states she does have regular dentist. ? ?The history is provided by the patient.  ?Past Medical History:  ?Diagnosis Date  ? Allergy   ? seasonal allergies  ? Anxiety   ? Arthritis   ? knees bilaterally  ? Asthma   ? allergy induced asthma, no inhaler used  ? Back pain   ? Common migraine with intractable migraine 01/26/2021  ? Depression   ? Ectopic pregnancy without intrauterine pregnancy 2013  ? Family history of adverse reaction to anesthesia   ? both grandmother slow to awaken after anesthesia  ? GERD (gastroesophageal reflux disease)   ? Headache(784.0)   ? migraines  ? History of kidney stones   ? Hypertension   ? Joint pain   ? Lactose intolerance   ? SVD (spontaneous vaginal delivery) 02/25/2014  ? Vitamin D deficiency   ? ?Patient Active Problem List  ? Diagnosis Date Noted  ? 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 Common migraine with intractable migraine 01/26/2021  ? SVD (spontaneous vaginal delivery) 02/25/2014  ? Postpartum care following vaginal delivery (02/24/14) 02/24/2014  ? Ectopic pregnancy 11/18/2012  ? ?Past Surgical History:  ?Procedure Laterality Date  ? DILATION AND CURETTAGE OF UTERUS  11/18/2012  ? Procedure: DILATATION AND CURETTAGE;  Surgeon: Serita KyleSheronette A Cousins, MD;  Location: [facility];  Service: Gynecology;  Laterality: N/A;  ? falopian tube removed  December 2013  ? had a tubal pregnancy  ? LAPAROSCOPIC TUBAL LIGATION Right 11/16/2020  ? Procedure: LAPAROSCOPIC Right TUBAL LIGATION By Salpingectomy;  Surgeon: Toy BakerLaw, Cassandra A, DO;   Location: [facility];  Service: Gynecology;  Laterality: Right;  ? LAPAROSCOPY  11/18/2012  ? Procedure: LAPAROSCOPY OPERATIVE;  Surgeon: Serita KyleSheronette A Cousins, MD;  Location: [facility];  Service: Gynecology;  Laterality: N/A;  Operative Laparoscopy with removal of left fallopian tube and ectopic pregnancy, Dilation and evacuation  ? ?OB History   ? ? Gravida  ?3  ? Para  ?2  ? Term  ?2  ? Preterm  ?   ? AB  ?1  ? Living  ?2  ?  ? ? SAB  ?   ? IAB  ?   ? Ectopic  ?1  ? Multiple  ?   ? Live Births  ?2  ?   ?  ?  ? ?Home Medications   ? ?Prior to Admission medications   ?Medication Sig Start Date End Date Taking? Authorizing Provider  ?albuterol (VENTOLIN HFA) 108 (90 Base) MCG/ACT inhaler Inhale 1-2 puffs into the lungs every 6 (six) hours as needed for wheezing or shortness of breath. 01/18/22  Yes Gustavus BryantMound, Haley E, FNP  ?amitriptyline (ELAVIL) 10 MG tablet Take 10 mg by mouth daily. 03/03/22  Yes [provider]  ?AZO-CRANBERRY PO Take 2 tablets by mouth daily.   Yes [provider]  ?B Complex-Biotin-FA (SUPER B-50 B COMPLEX PO) Take by mouth daily.   Yes [provider]  ?benzonatate (TESSALON) 100 MG capsule  Take 1 capsule (100 mg total) by mouth every 8 (eight) hours as needed for cough. 01/13/22  Yes [place], Rolly Salter E, FNP  ?buPROPion (WELLBUTRIN) 100 MG tablet Take 100 mg by mouth daily. 01/18/21  Yes [provider]  ?chlorhexidine (PERIDEX) 0.12 % solution SMARTSIG:By Mouth 04/07/21  Yes [provider]  ?fluconazole (DIFLUCAN) 150 MG tablet Take 1 tablet on day 4 of antibiotics.  Take second tablet 3 days later. 03/06/22  Yes Theadora Rama Scales, PA-C  ?fluticasone (FLONASE) 50 MCG/ACT nasal spray Place 2 sprays into both nostrils daily. ?Patient taking differently: Place 2 sprays into both nostrils as needed. 08/19/16  Yes Shade Flood, MD  ?hydrochlorothiazide (HYDRODIURIL) 25 MG tablet Take 25 mg by mouth at bedtime.   Yes [provider]   ?hydrOXYzine (ATARAX/VISTARIL) 25 MG tablet Take 25 mg by mouth every 4 (four) hours as needed. Takes 1 tab qhs   Yes [provider]  ?ibuprofen (ADVIL) 800 MG tablet Take 1 tablet (800 mg total) by mouth every 8 (eight) hours as needed for up to 21 doses for fever, headache, mild pain or moderate pain. 03/06/22  Yes Theadora Rama Scales, PA-C  ?loratadine (CLARITIN) 10 MG tablet Take 10 mg by mouth at bedtime.    Yes [provider]  ?montelukast (SINGULAIR) 10 MG tablet Take 10 mg by mouth at bedtime.   Yes [provider]  ?penicillin v potassium (VEETID) 500 MG tablet Take 1 tablet (500 mg total) by mouth 3 (three) times daily for 14 days. 03/06/22 03/20/22 Yes Theadora Rama Scales, PA-C  ?phentermine 30 MG capsule Take 30 mg by mouth daily. 01/13/21  Yes [provider]  ?promethazine (PHENERGAN) 12.5 MG tablet Take 12.5 mg by mouth every 8 (eight) hours as needed. 01/21/21  Yes [provider]  ?SUMAtriptan (IMITREX) 100 MG tablet Take 1 tablet (100 mg total) by mouth 2 (two) times daily as needed for migraine. 01/26/21  Yes York Spaniel, MD  ?topiramate (TOPAMAX) 100 MG tablet Take 1 tablet (100 mg total) by mouth at bedtime. 02/07/22  Yes Glean Salvo, NP  ?UNABLE TO FIND Probiotic 1 daily   Yes [provider]  ? ? ?Family History ?Family History  ?Problem Relation Age of Onset  ? Asthma Mother   ? Obesity Mother   ? Hypertension Mother   ? Diabetes Father   ? Hypertension Father   ? Asthma Brother   ? Asthma Daughter   ? Cancer Maternal Grandmother   ?     breast  ? Diabetes Paternal Grandmother   ? Hypertension Paternal Grandmother   ? Diabetes Paternal Grandfather   ? Hypertension Paternal Grandfather   ? Hearing loss Paternal Grandfather   ?     work related  ? Other Neg Hx   ? ?Social History ?Social History  ? ?Tobacco Use  ? Smoking status: Every Day  ?  Packs/day: 0.50  ?  Years: 10.00  ?  Pack years: 5.00  ?  Types: Cigarettes  ? Smokeless  tobacco: Never  ?Vaping Use  ? Vaping Use: Never used  ?Substance Use Topics  ? Alcohol use: Yes  ?  Comment: occ  ? Drug use: Never  ? ?Allergies   ?Patient has no known allergies. ? ?Review of Systems ?Review of Systems ?Pertinent findings noted in history of present illness.  ? ?Physical Exam ?Triage Vital Signs ?ED Triage Vitals  ?Enc Vitals Group  ?   BP 10/08/21 0827 Marland Kitchen)  147/82  ?   Pulse Rate 10/08/21 0827 72  ?   Resp 10/08/21 0827 18  ?   Temp 10/08/21 0827 98.3 ?F (36.8 ?C)  ?   Temp Source 10/08/21 0827 Oral  ?   SpO2 10/08/21 0827 98 %  ?   Weight --   ?   Height --   ?   Head Circumference --   ?   Peak Flow --   ?   Pain Score 10/08/21 0826 5  ?   Pain Loc --   ?   Pain Edu? --   ?   Excl. in GC? --   ?No data found. ? ?Updated Vital Signs ?BP 130/85 (BP Location: Left Arm)   Pulse (!) 101   Temp 98.3 ?F (36.8 ?C) (Oral)   Resp 20   Ht 5\' 3"  (1.6 m)   Wt 192 lb 0.3 oz (87.1 kg)   SpO2 97%   Breastfeeding No   BMI 34.01 kg/m?  ? ?Physical Exam ?Vitals and nursing note reviewed.  ?Constitutional:   ?   General: She is not in acute distress. ?   Appearance: Normal appearance. She is not ill-appearing.  ?HENT:  ?   Head: Normocephalic and atraumatic.  ?   Salivary Glands: Right salivary gland is not diffusely enlarged or tender. Left salivary gland is not diffusely enlarged or tender.  ?   Right Ear: Tympanic membrane, ear canal and external ear normal. No drainage. No middle ear effusion. There is no impacted cerumen. Tympanic membrane is not erythematous or bulging.  ?   Left Ear: Tympanic membrane, ear canal and external ear normal. No drainage.  No middle ear effusion. There is no impacted cerumen. Tympanic membrane is not erythematous or bulging.  ?   Nose: Nose normal. No nasal deformity, septal deviation, mucosal edema, congestion or rhinorrhea.  ?   Right Turbinates: Not enlarged, swollen or pale.  ?   Left Turbinates: Not enlarged, swollen or pale.  ?   Right Sinus: No maxillary sinus  tenderness or frontal sinus tenderness.  ?   Left Sinus: No maxillary sinus tenderness or frontal sinus tenderness.  ?   Mouth/Throat:  ?   Lips: Pink. No lesions.  ?   Mouth: Mucous membranes are moist. No oral lesions.  ?

## 2022-03-06 NOTE — ED Triage Notes (Signed)
Patient c/o left sided dental pain, swelling, sharp pain x 2 days.  Unable to eat on that side.  Patient taken Ibuprofen 800mg  q6 hours. ?

## 2022-07-09 ENCOUNTER — Ambulatory Visit (HOSPITAL_COMMUNITY)
Admission: EM | Admit: 2022-07-09 | Discharge: 2022-07-09 | Disposition: A | Discharge status: Home / Self Care | Payer: 59 | Attending: Family | Admitting: Family

## 2022-07-09 DIAGNOSIS — F331 Major depressive disorder, recurrent, moderate: Secondary | ICD-10-CM

## 2022-07-09 DIAGNOSIS — F411 Generalized anxiety disorder: Secondary | ICD-10-CM

## 2022-07-09 MED ORDER — TRAZODONE HCL 50 MG PO TABS: 50.0000 mg | ORAL_TABLET | Freq: Every evening | ORAL | 0 refills | Status: AC | PRN

## 2022-07-09 MED ORDER — HYDROXYZINE HCL 50 MG PO TABS: 50.0000 mg | ORAL_TABLET | Freq: Four times a day (QID) | ORAL | 0 refills | Status: AC | PRN

## 2022-07-09 MED ORDER — TRAZODONE HCL 50 MG PO TABS: 50.0000 mg | ORAL_TABLET | Freq: Every evening | ORAL | Status: DC | PRN

## 2022-07-09 MED ORDER — HYDROXYZINE HCL 25 MG PO TABS: 50.0000 mg | ORAL_TABLET | Freq: Four times a day (QID) | ORAL | Status: DC | PRN

## 2022-07-09 NOTE — Discharge Instructions (Addendum)
Take all medications as prescribed. Keep all follow-up appointments as scheduled.  Do not consume alcohol or use illegal drugs while on prescription medications. Report any adverse effects from your medications to your primary care provider promptly.  In the event of recurrent symptoms or worsening symptoms, call 911, a crisis hotline, or go to the nearest emergency department for evaluation.   

## 2022-07-09 NOTE — ED Triage Notes (Signed)
Pt presents to [facility] accompanied by her husband due to worsening anxiety/depression. Pt states she has increased agitation,stress and lack of sleep. Pt states she would like to get a medication adjustment. Pt denies SI/HI and AVH.

## 2022-07-09 NOTE — ED Provider Notes (Signed)
[facility]al [facility] Urgent [facility] Medical Screening Exam  Patient Name: Diane [name] MRN: [id number] Date of Evaluation: 07/09/22 Chief Complaint:   Diagnosis:  Final diagnoses:  None    History of Present illness: Diane [name] is a 36 y.o. female.  Presents to [facility] urgent care with concerns of worsening depression and anxiety.  She reports she is currently followed by primary care where she is prescribed Wellbutrin 100 mg, amitriptyline 10 mg and hydroxyzine 25 mg  for depression and anxiety.  Reported she was discontinued from Lexapro.  She reports she has been taking and tolerating her medications well however feels that she has reached a "plateau."  States she has been waking up with chest tightness and experiencing panic symptoms.  States she does not feel like her hydroxyzine is helping her symptoms at this point.  She is denying suicidal or homicidal ideations.  Denies auditory visual hallucinations.  She reports she has a follow-up appointment with her primary care provider August 7th.    States she is currently employed by [employer].  Denied that she is followed by therapy or psychiatry currently.  Denied previous inpatient admissions.  Denied illicit drug use or substance abuse history.  Discussed following up with primary care to collect TSH, will make trazodone 50 mg p.o. as needed nightly x15 tablets and increase hydroxyzine to 50 mg p.o. 3 times daily.  Patient was receptive to plan.  Encouraged to follow-up with walk-in clinic for medication management. -Consider discontinuing phentermine due to reported increased anxiety and panic symptoms.   During evaluation Diane [name] is  in no acute distress. She is alert/oriented x 4; calm/cooperative; and mood congruent with affect.  She is speaking in a clear tone at moderate volume, and normal pace; with good eye contact. Her thought process is coherent and relevant; There is no indication that she is currently  responding to internal/external stimuli or experiencing delusional thought content; and she has denied suicidal/self-harm/homicidal ideation, psychosis, and paranoia.  Patient has remained calm throughout assessment and has answered questions appropriately.     At this time Diane [name] is educated and verbalizes understanding of mental health resources and other crisis services in the community. She is instructed to call 911 and or [phone] and present to the nearest emergency room should she experience any suicidal/homicidal ideation, auditory/visual/hallucinations, or detrimental worsening of her mental health condition. She was a also advised by Clinical research associate that she could call the toll-free phone on insurance card to assist with identifying in network counselors and agencies or number on back of Medicaid card to speak with care coordinator    Psychiatric Specialty Exam  Presentation  General Appearance:Appropriate for Environment  Eye Contact:Good  Speech:Clear and Coherent  Speech Volume:Normal  Handedness:Right   Mood and Affect  Mood:Anxious; Depressed  Affect:Congruent   Thought Process  Thought Processes:Coherent  Descriptions of Associations:Intact  Orientation:Full (Time, Place and Person)  Thought Content:Logical    Hallucinations:None  Ideas of Reference:None  Suicidal Thoughts:No  Homicidal Thoughts:No   Sensorium  Memory:Recent Good; Remote Good; Immediate Good  Judgment:Good  Insight:Good   Executive Functions  Concentration:Fair  Attention Span:Good  Recall:Good  Fund of Knowledge:Good  Language:Good   Psychomotor Activity  Psychomotor Activity:Normal   Assets  Assets:Desire for Improvement   Sleep  Sleep:Fair  Number of hours: No data recorded  Nutritional Assessment (For [facility] and [facility] admissions only) Has the patient had a weight loss or gain of 10 pounds or more in the  last 3 months?: No Has the patient had a decrease in food  intake/or appetite?: No Does the patient have dental problems?: No Does the patient have eating habits or behaviors that may be indicators of an eating disorder including binging or inducing vomiting?: No Has the patient recently lost weight without trying?: 0 Has the patient been eating poorly because of a decreased appetite?: 0 Malnutrition Screening Tool Score: 0    Physical Exam: Physical Exam Vitals and nursing note reviewed.  Cardiovascular:     Rate and Rhythm: Regular rhythm. Tachycardia present.     Pulses: Normal pulses.  Skin:    General: Skin is warm and dry.  Neurological:     Mental Status: She is oriented to person, place, and time.  Psychiatric:        Attention and Perception: Attention and perception normal.        Mood and Affect: Mood normal.        Behavior: Behavior normal.        Thought Content: Thought content normal.        Cognition and Memory: Cognition normal.        Judgment: Judgment normal.    Review of Systems  HENT: Negative.    Eyes: Negative.   Cardiovascular:  Positive for palpitations.  Genitourinary: Negative.   Musculoskeletal: Negative.   Skin: Negative.   Endo/Heme/Allergies: Negative.   Psychiatric/Behavioral:  Positive for depression. The patient is nervous/anxious.   All other systems reviewed and are negative.  There were no vitals taken for this visit. There is no height or weight on file to calculate BMI.  Musculoskeletal: Strength & Muscle Tone: within normal limits Gait & Station: normal Patient leans: N/A   [facility] MSE Discharge Disposition for Follow up and Recommendations: Based on my evaluation the patient does not appear to have an emergency medical condition and can be discharged with resources and follow up care in outpatient services for Medication Management, Partial Hospitalization Program, and Individual Therapy  Discussed following up with primary care to collect TSH, will make trazodone 50 mg p.o. as needed  nightly x15 tablets and increase hydroxyzine to 50 mg p.o. 3 times daily.  Patient was receptive to plan.  Encouraged to follow-up with walk-in clinic for medication management. -Consider discontinuing phentermine due to reported increased anxiety and panic symptoms.   Oneta Rack, NP 07/09/2022, 5:56 PM

## 2023-02-16 ENCOUNTER — Ambulatory Visit
Admission: RE | Admit: 2023-02-16 | Discharge: 2023-02-16 | Disposition: A | Discharge status: Home / Self Care | Payer: BLUE CROSS/BLUE SHIELD | Source: Ambulatory Visit | Attending: Family Medicine | Admitting: Family Medicine

## 2023-02-16 VITALS — BP 125/85 | HR 99 | Temp 98.4°F | Resp 16

## 2023-02-16 DIAGNOSIS — M546 Pain in thoracic spine: Secondary | ICD-10-CM | POA: Diagnosis not present

## 2023-02-16 MED ORDER — IBUPROFEN 800 MG PO TABS: 800.0000 mg | ORAL_TABLET | Freq: Three times a day (TID) | ORAL | 0 refills | Status: AC | PRN

## 2023-02-16 NOTE — ED Triage Notes (Signed)
Pt states upper back spasms between her shoulder blades for the past hour,  states it is worse when she moves her head. Denies injury. States she took a back and body ache medication PTA.

## 2023-02-16 NOTE — ED Provider Notes (Signed)
[facility]    CSN: [id number] Arrival date & time: 02/16/23  1157      History   Chief Complaint Chief Complaint  Patient presents with   Back Pain    Back spasms by shoulder blade. Breathtaking - Entered by patient    HPI Diane [name] is a 37 y.o. female.   Patient presents with right upper back spasms and pain that started today early this morning.  Patient denies any obvious injury to the area recently.  Denies history of chronic back pain or any previous injuries.  Denies numbness or tingling.  Reports movement exacerbates pain.  Patient has taken an over-the-counter medication that she is not sure the name of with minimal improvement in symptoms.   Back Pain   Past Medical History:  Diagnosis Date   Allergy    seasonal allergies   Anxiety    Arthritis    knees bilaterally   Asthma    allergy induced asthma, no inhaler used   Back pain    Common migraine with intractable migraine 01/26/2021   Depression    Ectopic pregnancy without intrauterine pregnancy 2013   Family history of adverse reaction to anesthesia    both grandmother slow to awaken after anesthesia   GERD (gastroesophageal reflux disease)    Headache(784.0)    migraines   History of kidney stones    Hypertension    Joint pain    Lactose intolerance    SVD (spontaneous vaginal delivery) 02/25/2014   Vitamin D deficiency     Patient Active Problem List   Diagnosis Date Noted   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   Common migraine with intractable migraine 01/26/2021   SVD (spontaneous vaginal delivery) 02/25/2014   Postpartum care following vaginal delivery (02/24/14) 02/24/2014   Ectopic pregnancy 11/18/2012    Past Surgical History:  Procedure Laterality Date   DILATION AND CURETTAGE OF UTERUS  11/18/2012   Procedure: DILATATION AND CURETTAGE;  Surgeon: Marvene Staff, MD;  Location: [facility];  Service: Gynecology;  Laterality: N/A;   falopian tube removed  December 2013   had a  tubal pregnancy   LAPAROSCOPIC TUBAL LIGATION Right 11/16/2020   Procedure: LAPAROSCOPIC Right TUBAL LIGATION By Salpingectomy;  Surgeon: Armandina Stammer, DO;  Location: [facility];  Service: Gynecology;  Laterality: Right;   LAPAROSCOPY  11/18/2012   Procedure: LAPAROSCOPY OPERATIVE;  Surgeon: Marvene Staff, MD;  Location: [facility];  Service: Gynecology;  Laterality: N/A;  Operative Laparoscopy with removal of left fallopian tube and ectopic pregnancy, Dilation and evacuation    OB History     Gravida  3   Para  2   Term  2   Preterm      AB  1   Living  2      SAB      IAB      Ectopic  1   Multiple      Live Births  2            Home Medications    Prior to Admission medications   Medication Sig Start Date End Date Taking? Authorizing Provider  amLODipine (NORVASC) 10 MG tablet Take 10 mg by mouth daily. 02/14/23  Yes [provider]  buPROPion (WELLBUTRIN SR) 150 MG 12 hr tablet Take 150 mg by mouth daily. 01/30/23  Yes [provider]  ibuprofen (ADVIL) 800 MG tablet Take 1 tablet (800 mg total) by mouth every 8 (eight) hours as needed  for mild pain or moderate pain. 02/16/23  Yes , Michele Rockers, FNP  albuterol (VENTOLIN HFA) 108 (90 Base) MCG/ACT inhaler Inhale 1-2 puffs into the lungs every 6 (six) hours as needed for wheezing or shortness of breath. 01/18/22   Teodora Medici, FNP  amitriptyline (ELAVIL) 10 MG tablet Take 10 mg by mouth daily. 03/03/22   [provider]  AZO-CRANBERRY PO Take 2 tablets by mouth daily.    [provider]  B Complex-Biotin-FA (SUPER B-50 B COMPLEX PO) Take by mouth daily.    [provider]  benzonatate (TESSALON) 100 MG capsule Take 1 capsule (100 mg total) by mouth every 8 (eight) hours as needed for cough. 01/13/22   Teodora Medici, FNP  buPROPion (WELLBUTRIN) 100 MG tablet Take 100 mg by mouth daily. 01/18/21   [provider]  chlorhexidine (PERIDEX) 0.12 %  solution SMARTSIG:By Mouth 04/07/21   [provider]  fluconazole (DIFLUCAN) 150 MG tablet Take 1 tablet on day 4 of antibiotics.  Take second tablet 3 days later. 03/06/22   Lynden Oxford Scales, PA-C  fluticasone (FLONASE) 50 MCG/ACT nasal spray Place 2 sprays into both nostrils daily. Patient taking differently: Place 2 sprays into both nostrils as needed. 08/19/16   Wendie Agreste, MD  hydrochlorothiazide (HYDRODIURIL) 25 MG tablet Take 25 mg by mouth at bedtime.    [provider]  hydrOXYzine (ATARAX) 50 MG tablet Take 1 tablet (50 mg total) by mouth every 6 (six) hours as needed for anxiety. 07/09/22   Derrill Center, NP  loratadine (CLARITIN) 10 MG tablet Take 10 mg by mouth at bedtime.     [provider]  montelukast (SINGULAIR) 10 MG tablet Take 10 mg by mouth at bedtime.    [provider]  phentermine 30 MG capsule Take 30 mg by mouth daily. 01/13/21   [provider]  promethazine (PHENERGAN) 12.5 MG tablet Take 12.5 mg by mouth every 8 (eight) hours as needed. 01/21/21   [provider]  SUMAtriptan (IMITREX) 100 MG tablet Take 1 tablet (100 mg total) by mouth 2 (two) times daily as needed for migraine. 01/26/21   Kathrynn Ducking, MD  topiramate (TOPAMAX) 100 MG tablet Take 1 tablet (100 mg total) by mouth at bedtime. 02/07/22   Suzzanne Cloud, NP  traZODone (DESYREL) 50 MG tablet Take 1 tablet (50 mg total) by mouth at bedtime as needed for sleep. 07/09/22   Derrill Center, NP  UNABLE TO FIND Probiotic 1 daily    [provider]    Family History Family History  Problem Relation Age of Onset   Asthma Mother    Obesity Mother    Hypertension Mother    Diabetes Father    Hypertension Father    Asthma Brother    Asthma Daughter    Cancer Maternal Grandmother        breast   Diabetes Paternal Grandmother    Hypertension Paternal Grandmother    Diabetes Paternal Grandfather    Hypertension Paternal Grandfather     Hearing loss Paternal Grandfather        work related   Other Neg Hx     Social History Social History   Tobacco Use   Smoking status: Every Day    Packs/day: 0.50    Years: 10.00    Total pack years: 5.00    Types: Cigarettes   Smokeless tobacco: Never  Vaping Use   Vaping Use: Never used  Substance Use Topics   Alcohol use: Yes    Comment: occ   Drug use: Never     Allergies   Patient has no known allergies.   Review of Systems Review of Systems Per HPI  Physical Exam Triage Vital Signs ED Triage Vitals  Enc Vitals Group     BP 02/16/23 1208 125/85     Pulse Rate 02/16/23 1208 99     Resp 02/16/23 1208 16     Temp 02/16/23 1208 98.4 F (36.9 C)     Temp Source 02/16/23 1208 Oral     SpO2 02/16/23 1208 98 %     Weight --      Height --      Head Circumference --      Peak Flow --      Pain Score 02/16/23 1209 8     Pain Loc --      Pain Edu? --      Excl. in [place]? --    No data found.  Updated Vital Signs BP 125/85 (BP Location: Right Arm)   Pulse 99   Temp 98.4 F (36.9 C) (Oral)   Resp 16   SpO2 98%   Visual Acuity Right Eye Distance:   Left Eye Distance:   Bilateral Distance:    Right Eye Near:   Left Eye Near:    Bilateral Near:     Physical Exam Constitutional:      General: She is not in acute distress.    Appearance: Normal appearance. She is not toxic-appearing or diaphoretic.  HENT:     Head: Normocephalic and atraumatic.  Eyes:     Extraocular Movements: Extraocular movements intact.     Conjunctiva/sclera: Conjunctivae normal.  Pulmonary:     Effort: Pulmonary effort is normal.  Musculoskeletal:       Back:     Comments: Tenderness to palpation to right upper thoracic back.  No tenderness to neck or adjacent shoulder.  No swelling or discoloration noted.  No direct spinal tenderness, crepitus, step-off noted.  Neurological:     General: No focal deficit present.     Mental Status: She is alert and oriented to person,  place, and time. Mental status is at baseline.  Psychiatric:        Mood and Affect: Mood normal.        Behavior: Behavior normal.        Thought Content: Thought content normal.        Judgment: Judgment normal.      UC Treatments / Results  Labs (all labs ordered are listed, but only abnormal results are displayed) Labs Reviewed - No data to display  EKG   Radiology No results found.  Procedures Procedures (including critical care time)  Medications Ordered in UC Medications - No data to display  Initial Impression / Assessment and Plan / UC Course  I have reviewed the triage vital signs and the nursing notes.  Pertinent labs & imaging results that were available during my care of the patient were reviewed by me and considered in my medical decision making (see chart for details).     Physical exam appears consistent with muscle strain/injury.  Given no direct injury or spinal tenderness, will defer imaging.  Limited options on pain management given patient's daily medications.  Will treat with prescription ibuprofen.  Advised patient to not take any additional ibuprofen while taking prescription ibuprofen.  Advised supportive care.  Advised strict return precautions.  Patient  verbalized understanding and was agreeable with plan. Final Clinical Impressions(s) / UC Diagnoses   Final diagnoses:  Acute right-sided thoracic back pain     Discharge Instructions      I have prescribed you ibuprofen to take as needed.  Do not take any additional ibuprofen, Advil, Aleve.  Follow-up if any symptoms persist or worsen.    ED Prescriptions     Medication Sig Dispense Auth. Provider   ibuprofen (ADVIL) 800 MG tablet Take 1 tablet (800 mg total) by mouth every 8 (eight) hours as needed for mild pain or moderate pain. 21 tablet [place], Michele Rockers, [place]      PDMP not reviewed this encounter.   Teodora Medici, [place] 02/16/23 [phone]

## 2023-02-16 NOTE — Discharge Instructions (Signed)
I have prescribed you ibuprofen to take as needed.  Do not take any additional ibuprofen, Advil, Aleve.  Follow-up if any symptoms persist or worsen.

## 2023-02-21 ENCOUNTER — Ambulatory Visit
Admission: EM | Admit: 2023-02-21 | Discharge: 2023-02-21 | Disposition: A | Discharge status: Home / Self Care | Payer: BLUE CROSS/BLUE SHIELD | Attending: Urgent Care | Admitting: Urgent Care

## 2023-02-21 ENCOUNTER — Ambulatory Visit (INDEPENDENT_AMBULATORY_CARE_PROVIDER_SITE_OTHER): Payer: BLUE CROSS/BLUE SHIELD

## 2023-02-21 DIAGNOSIS — M545 Low back pain, unspecified: Secondary | ICD-10-CM

## 2023-02-21 DIAGNOSIS — G44209 Tension-type headache, unspecified, not intractable: Secondary | ICD-10-CM | POA: Diagnosis not present

## 2023-02-21 DIAGNOSIS — S39012A Strain of muscle, fascia and tendon of lower back, initial encounter: Secondary | ICD-10-CM | POA: Diagnosis not present

## 2023-02-21 MED ORDER — NAPROXEN 500 MG PO TABS: 500.0000 mg | ORAL_TABLET | Freq: Two times a day (BID) | ORAL | 0 refills | Status: AC

## 2023-02-21 MED ORDER — CYCLOBENZAPRINE HCL 5 MG PO TABS: 5.0000 mg | ORAL_TABLET | Freq: Every evening | ORAL | 0 refills | Status: AC | PRN
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
